# Patient Record
Sex: Male | Born: 1985 | Race: White | Hispanic: No | Marital: Single | Smoking: Current every day smoker
Health system: Southern US, Community
[De-identification: ages and names within clinical notes are randomized; demographics above are authoritative.]

## PROBLEM LIST (undated history)

## (undated) DIAGNOSIS — Q819 Epidermolysis bullosa, unspecified: Secondary | ICD-10-CM

## (undated) DIAGNOSIS — J45909 Unspecified asthma, uncomplicated: Secondary | ICD-10-CM

## (undated) DIAGNOSIS — F419 Anxiety disorder, unspecified: Secondary | ICD-10-CM

## (undated) DIAGNOSIS — T7840XA Allergy, unspecified, initial encounter: Secondary | ICD-10-CM

## (undated) HISTORY — PX: APPENDECTOMY: SHX54

## (undated) HISTORY — DX: Allergy, unspecified, initial encounter: T78.40XA

## (undated) HISTORY — DX: Epidermolysis bullosa, unspecified: Q81.9

## (undated) HISTORY — DX: Unspecified asthma, uncomplicated: J45.909

## (undated) HISTORY — DX: Anxiety disorder, unspecified: F41.9

---
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
## 2004-07-02 ENCOUNTER — Emergency Department (HOSPITAL_COMMUNITY): Admission: EM | Admit: 2004-07-02 | Discharge: 2004-07-02 | Payer: Self-pay | Admitting: Emergency Medicine

## 2005-06-11 ENCOUNTER — Emergency Department (HOSPITAL_COMMUNITY): Admission: EM | Admit: 2005-06-11 | Discharge: 2005-06-11 | Payer: Self-pay | Admitting: Emergency Medicine

## 2005-12-18 ENCOUNTER — Emergency Department (HOSPITAL_COMMUNITY): Admission: EM | Admit: 2005-12-18 | Discharge: 2005-12-18 | Payer: Self-pay | Admitting: Emergency Medicine

## 2011-01-05 ENCOUNTER — Emergency Department (HOSPITAL_COMMUNITY)
Admission: EM | Admit: 2011-01-05 | Discharge: 2011-01-05 | Disposition: A | Discharge status: Home / Self Care | Payer: Self-pay | Attending: Emergency Medicine | Admitting: Emergency Medicine

## 2011-01-05 DIAGNOSIS — M5430 Sciatica, unspecified side: Secondary | ICD-10-CM | POA: Insufficient documentation

## 2011-01-05 DIAGNOSIS — M549 Dorsalgia, unspecified: Secondary | ICD-10-CM | POA: Insufficient documentation

## 2012-04-25 ENCOUNTER — Ambulatory Visit: Payer: Self-pay | Admitting: Family Medicine

## 2012-04-25 VITALS — BP 111/74 | HR 82 | Temp 99.2°F | Resp 18 | Ht 70.5 in | Wt 190.0 lb

## 2012-04-25 DIAGNOSIS — L02411 Cutaneous abscess of right axilla: Secondary | ICD-10-CM

## 2012-04-25 DIAGNOSIS — IMO0002 Reserved for concepts with insufficient information to code with codable children: Secondary | ICD-10-CM

## 2012-04-25 MED ORDER — HYDROCODONE-ACETAMINOPHEN 5-325 MG PO TABS: 1.0000 | ORAL_TABLET | Freq: Four times a day (QID) | ORAL | Status: DC | PRN

## 2012-04-25 MED ORDER — DOXYCYCLINE HYCLATE 100 MG PO TABS: 100.0000 mg | ORAL_TABLET | Freq: Two times a day (BID) | ORAL | Status: DC

## 2012-04-25 NOTE — Progress Notes (Signed)
Is a 27 year old man with a history of boils on his abdomen who comes in with 5 days of swelling, pain in his right axilla.  Objective: 2 cm swollen red mass in the right axilla consistent with an abscess  Please see PA note on I&D  Assessment: Right axillary abscess with recurrent boils  Plan: 1. Abscess of axilla, right  doxycycline (VIBRA-TABS) 100 MG tablet, HYDROcodone-acetaminophen (NORCO/VICODIN) 5-325 MG per tablet

## 2012-04-25 NOTE — Progress Notes (Signed)
Patient ID: ITAY MELLA MRN: [id number], DOB: 26-Aug-1985, 27 y.o. Date of Encounter: 04/25/2012, 7:24 PM    PROCEDURE NOTE: Verbal consent obtained. Betadine prep per usual protocol. Local anesthesia obtained with 2% lidocaine plain.  1 cm incision made with 11 blade along lesion.  Copious purulence expressed. Lesion explored revealing no loculations. Packed with 1/4 plain packing. Dressed. Wound care instructions including precautions with patient. Patient tolerated the procedure well. Recheck in 48 hours.      Grier Mitts, PA-C 04/25/2012 7:24 PM

## 2012-04-25 NOTE — Patient Instructions (Signed)
Abscess An abscess is an infected area that contains a collection of pus and debris. It can occur in almost any part of the body. An abscess is also known as a furuncle or boil. CAUSES   An abscess occurs when tissue gets infected. This can occur from blockage of oil or sweat glands, infection of hair follicles, or a minor injury to the skin. As the body tries to fight the infection, pus collects in the area and creates pressure under the skin. This pressure causes pain. People with weakened immune systems have difficulty fighting infections and get certain abscesses more often.   SYMPTOMS Usually an abscess develops on the skin and becomes a painful mass that is red, warm, and tender. If the abscess forms under the skin, you may feel a moveable soft area under the skin. Some abscesses break open (rupture) on their own, but most will continue to get worse without care. The infection can spread deeper into the body and eventually into the bloodstream, causing you to feel ill.   DIAGNOSIS   Your caregiver will take your medical history and perform a physical exam. A sample of fluid may also be taken from the abscess to determine what is causing your infection. TREATMENT   Your caregiver may prescribe antibiotic medicines to fight the infection. However, taking antibiotics alone usually does not cure an abscess. Your caregiver may need to make a small cut (incision) in the abscess to drain the pus. In some cases, gauze is packed into the abscess to reduce pain and to continue draining the area. HOME CARE INSTRUCTIONS    Only take over-the-counter or prescription medicines for pain, discomfort, or fever as directed by your caregiver.   If you were prescribed antibiotics, take them as directed. Finish them even if you start to feel better.   If gauze is used, follow your caregiver's directions for changing the gauze.   To avoid spreading the infection:   Keep your draining abscess covered with a  bandage.   Wash your hands well.   Do not share personal care items, towels, or whirlpools with others.   Avoid skin contact with others.   Keep your skin and clothes clean around the abscess.   Keep all follow-up appointments as directed by your caregiver.  SEEK MEDICAL CARE IF:    You have increased pain, swelling, redness, fluid drainage, or bleeding.   You have muscle aches, chills, or a general ill feeling.   You have a fever.  MAKE SURE YOU:    Understand these instructions.   Will watch your condition.   Will get help right away if you are not doing well or get worse.  Document Released: 12/31/2004 Document Revised: 09/22/2011 Document Reviewed: 06/05/2011 [employer] Patient Information 2013 [employer].    

## 2012-04-27 ENCOUNTER — Telehealth: Payer: Self-pay
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
## 2012-04-28 ENCOUNTER — Ambulatory Visit (INDEPENDENT_AMBULATORY_CARE_PROVIDER_SITE_OTHER): Payer: Self-pay | Admitting: Physician Assistant

## 2012-04-28 VITALS — BP 110/78 | HR 88 | Temp 99.4°F | Resp 18

## 2012-04-28 DIAGNOSIS — R062 Wheezing: Secondary | ICD-10-CM

## 2012-04-28 DIAGNOSIS — J40 Bronchitis, not specified as acute or chronic: Secondary | ICD-10-CM

## 2012-04-28 DIAGNOSIS — IMO0002 Reserved for concepts with insufficient information to code with codable children: Secondary | ICD-10-CM

## 2012-04-28 DIAGNOSIS — L02419 Cutaneous abscess of limb, unspecified: Secondary | ICD-10-CM

## 2012-04-28 MED ORDER — PREDNISONE 20 MG PO TABS: ORAL_TABLET | ORAL | Status: DC

## 2012-04-28 NOTE — Telephone Encounter (Signed)
Left message for him to call back. He is due to follow up today regarding his abscesses.

## 2012-04-28 NOTE — Progress Notes (Signed)
Patient ID: Daniel Richmond MRN: [id number], DOB: 01/14/1986 27 y.o. Date of Encounter: 04/28/2012, 3:12 PM  Chief Complaint: Wound care   See previous note  HPI: 27 y.o. y/o male presents for wound care s/p I&D on 04/25/12. Doing well No issues or complaints Afebrile/ no chills No nausea or vomiting Tolerating doxycycline Pain resolved.  Daily dressing change Previous note reviewed  Patient also complains of 3 day history of cough and chest pains. He has had fever and chills that have improved slightly today. He does have a history of asthma controlled with albuterol inhaler as needed.  He does continue to smoke - about 6 cigarettes per day.  He denies nasal congestion, sore throat, nausea, vomiting, hemoptysis, or dizziness.    Past Medical History  Diagnosis Date  . Allergy   . Asthma   . Anxiety   . 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 Authorizing Provider  b complex vitamins tablet Take 1 tablet by mouth daily.   Yes Historical Provider, MD  doxycycline (VIBRA-TABS) 100 MG tablet Take 1 tablet (100 mg total) by mouth 2 (two) times daily. 04/25/12  Yes Elvina Sidle, MD  fish oil-omega-3 fatty acids 1000 MG capsule Take 2 g by mouth daily.   Yes Historical Provider, MD  HYDROcodone-acetaminophen (NORCO/VICODIN) 5-325 MG per tablet Take 1 tablet by mouth every 6 (six) hours as needed for pain. 04/25/12  Yes Elvina Sidle, MD  magnesium gluconate (MAGONATE) 500 MG tablet Take 500 mg by mouth 2 (two) times daily.   Yes Historical Provider, MD  Probiotic Product (ACIDOPHILUS PROBIOTIC BLEND PO) Take by mouth.   Yes Historical Provider, MD  sertraline (ZOLOFT) 50 MG tablet Take 50 mg by mouth daily.   Yes Historical Provider, MD  Multiple Vitamin (MULTIVITAMIN) tablet Take 1 tablet by mouth daily.    Historical Provider, MD  predniSONE (DELTASONE) 20 MG tablet Take 3 PO QAM x3days, 2 PO QAM  x3days, 1 PO QAM x3days 04/28/12   Nelva Nay, PA-C    Allergies: No Known Allergies  ROS: Constitutional: Afebrile, no chills Cardiovascular: negative for chest pain or palpitations Dermatological: Positive for wound. Negative for erythema, pain, or warmth  GI: No nausea or vomiting   EXAM: Physical Exam: Blood pressure 110/78, pulse 88, temperature 99.4 F (37.4 C), temperature source Oral, resp. rate 18., There is no height or weight on file to calculate BMI. General: Well developed, well nourished, in no acute distress. Nontoxic appearing. Head: Normocephalic, atraumatic, sclera non-icteric.  Neck: Supple. Lungs: Diffuse wheezing throughout lung fields. No rales or rhonchi noted.  Heart: Normal rate. Skin:  Warm and moist. Dressing and packing in place. No induration, erythema, or tenderness to palpation. Neuro: Alert and oriented X 3. Moves all extremities spontaneously. Normal gait.  Psych:  Responds to questions appropriately with a normal affect.       PROCEDURE: Dressing and packing removed. Minimal purulence expressed Wound bed healthy Irrigated with 1% plain lidocaine 5 cc. Repacked with 1/4 plain packing. Dressing applied  LAB: Culture: none  A/P: 27 y.o. y/o male with axillary cellulitis/abscess as above s/p I&D on 04/25/12 Wound care per above Continue doxycycline. Pain well controlled Daily dressing changes Recheck 48 hours  Asthma flare - will treat with prednisone taper At home nebulizer at least once daily.  Continue doxycycline Recheck lungs at recheck in 48 hours - if no improvement may need labs/x-ray/nebulizer  Grier Mitts, PA-C 04/28/2012 3:12 PM

## 2012-04-29 NOTE — Telephone Encounter (Signed)
Patient came in to be seen yesterday.

## 2012-04-30 ENCOUNTER — Ambulatory Visit (INDEPENDENT_AMBULATORY_CARE_PROVIDER_SITE_OTHER): Payer: Self-pay | Admitting: Physician Assistant

## 2012-04-30 VITALS — BP 108/72 | HR 78 | Temp 97.9°F | Resp 16

## 2012-04-30 DIAGNOSIS — IMO0002 Reserved for concepts with insufficient information to code with codable children: Secondary | ICD-10-CM

## 2012-04-30 DIAGNOSIS — L02411 Cutaneous abscess of right axilla: Secondary | ICD-10-CM

## 2012-04-30 NOTE — Progress Notes (Signed)
   [address] [place] [address]   Phone [phone]  Subjective:    Patient ID: Daniel Richmond, male    DOB: Mar 30, 1986, 27 y.o.   MRN: [id number]  HPI Pt presents to clinic for abscess and asthma recheck. He has been taking his medication without problems.  He feels much better in regards to his asthma - his breathing is almost back to normal.  The packing from his abscess fell out last night and he is worried that it is closing to early.  He has been  Doing warm compresses and friction to keep it open over night.  He has purulence expressed from it this am and last night.   Review of Systems  Constitutional: Negative for fever and chills.  Respiratory: Positive for cough (less), shortness of breath (less) and wheezing (less).   Skin: Positive for wound.       Objective:   Physical Exam  Vitals reviewed. Constitutional: He is oriented to person, place, and time. He appears well-developed and well-nourished.  HENT:  Head: Normocephalic and atraumatic.  Right Ear: External ear normal.  Left Ear: External ear normal.  Eyes: Conjunctivae normal are normal.  Cardiovascular: Regular rhythm and normal heart sounds.   Pulmonary/Chest: Effort normal and breath sounds normal. No respiratory distress. He has no wheezes.  Neurological: He is alert and oriented to person, place, and time.  Skin: Skin is warm and dry.       Wound with some mild surrounding erythema and induration.  Very small amount of purulent discharge expressed.  About 1 cm deep and repacked with 1/4 plain packing - Drsg placed.  Psychiatric: He has a normal mood and affect. His behavior is normal. Judgment and thought content normal.        Assessment & Plan:

## 2012-12-15 ENCOUNTER — Emergency Department (HOSPITAL_BASED_OUTPATIENT_CLINIC_OR_DEPARTMENT_OTHER): Payer: Self-pay

## 2012-12-15 ENCOUNTER — Emergency Department (HOSPITAL_BASED_OUTPATIENT_CLINIC_OR_DEPARTMENT_OTHER)
Admission: EM | Admit: 2012-12-15 | Discharge: 2012-12-15 | Disposition: A | Discharge status: Home / Self Care | Payer: Self-pay | Attending: Emergency Medicine | Admitting: Emergency Medicine

## 2012-12-15 ENCOUNTER — Encounter (HOSPITAL_BASED_OUTPATIENT_CLINIC_OR_DEPARTMENT_OTHER): Payer: Self-pay | Admitting: *Deleted

## 2012-12-15 DIAGNOSIS — S0181XA Laceration without foreign body of other part of head, initial encounter: Secondary | ICD-10-CM

## 2012-12-15 DIAGNOSIS — F17200 Nicotine dependence, unspecified, uncomplicated: Secondary | ICD-10-CM | POA: Insufficient documentation

## 2012-12-15 DIAGNOSIS — S0990XA Unspecified injury of head, initial encounter: Secondary | ICD-10-CM | POA: Insufficient documentation

## 2012-12-15 DIAGNOSIS — J45909 Unspecified asthma, uncomplicated: Secondary | ICD-10-CM | POA: Insufficient documentation

## 2012-12-15 DIAGNOSIS — Z792 Long term (current) use of antibiotics: Secondary | ICD-10-CM | POA: Insufficient documentation

## 2012-12-15 DIAGNOSIS — IMO0002 Reserved for concepts with insufficient information to code with codable children: Secondary | ICD-10-CM | POA: Insufficient documentation

## 2012-12-15 DIAGNOSIS — Z791 Long term (current) use of non-steroidal anti-inflammatories (NSAID): Secondary | ICD-10-CM | POA: Insufficient documentation

## 2012-12-15 DIAGNOSIS — T07XXXA Unspecified multiple injuries, initial encounter: Secondary | ICD-10-CM

## 2012-12-15 DIAGNOSIS — Z8776 Personal history of (corrected) congenital malformations of integument, limbs and musculoskeletal system: Secondary | ICD-10-CM | POA: Insufficient documentation

## 2012-12-15 DIAGNOSIS — Z79899 Other long term (current) drug therapy: Secondary | ICD-10-CM | POA: Insufficient documentation

## 2012-12-15 DIAGNOSIS — Z87768 Personal history of other specified (corrected) congenital malformations of integument, limbs and musculoskeletal system: Secondary | ICD-10-CM | POA: Insufficient documentation

## 2012-12-15 DIAGNOSIS — S0180XA Unspecified open wound of other part of head, initial encounter: Secondary | ICD-10-CM | POA: Insufficient documentation

## 2012-12-15 DIAGNOSIS — F411 Generalized anxiety disorder: Secondary | ICD-10-CM | POA: Insufficient documentation

## 2012-12-15 MED ORDER — ACETAMINOPHEN 500 MG PO TABS
1000.0000 mg | ORAL_TABLET | Freq: Once | ORAL | Status: AC
  Administered 2012-12-15: 1000 mg via ORAL

## 2012-12-15 MED ORDER — ACETAMINOPHEN 500 MG PO TABS
ORAL_TABLET | ORAL | Status: AC
  Administered 2012-12-15: 1000 mg via ORAL
  Filled 2012-12-15: qty 2

## 2012-12-15 MED ORDER — TRAMADOL HCL 50 MG PO TABS: 50.0000 mg | ORAL_TABLET | Freq: Four times a day (QID) | ORAL | Status: DC | PRN

## 2012-12-15 MED ORDER — NAPROXEN 250 MG PO TABS
ORAL_TABLET | ORAL | Status: AC
  Administered 2012-12-15: 500 mg via ORAL
  Filled 2012-12-15: qty 2

## 2012-12-15 MED ORDER — KETOROLAC TROMETHAMINE 60 MG/2ML IM SOLN
60.0000 mg | Freq: Once | INTRAMUSCULAR | Status: DC
  Filled 2012-12-15: qty 2

## 2012-12-15 MED ORDER — NAPROXEN 250 MG PO TABS
500.0000 mg | ORAL_TABLET | Freq: Once | ORAL | Status: AC
  Administered 2012-12-15: 500 mg via ORAL

## 2012-12-15 MED ORDER — LIDOCAINE-EPINEPHRINE-TETRACAINE (LET) SOLUTION
3.0000 mL | Freq: Once | NASAL | Status: AC
  Administered 2012-12-15: 05:00:00 3 mL via TOPICAL
  Filled 2012-12-15: qty 3
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
## 2012-12-15 NOTE — ED Notes (Signed)
MD at bedside suturing

## 2012-12-22 ENCOUNTER — Ambulatory Visit: Payer: Self-pay | Admitting: Family Medicine

## 2012-12-22 ENCOUNTER — Ambulatory Visit: Payer: Self-pay

## 2012-12-22 VITALS — BP 128/76 | HR 86 | Temp 98.6°F | Resp 20 | Ht 69.25 in | Wt 186.0 lb

## 2012-12-22 DIAGNOSIS — L02411 Cutaneous abscess of right axilla: Secondary | ICD-10-CM

## 2012-12-22 DIAGNOSIS — R079 Chest pain, unspecified: Secondary | ICD-10-CM

## 2012-12-22 DIAGNOSIS — R0781 Pleurodynia: Secondary | ICD-10-CM

## 2012-12-22 DIAGNOSIS — S0081XA Abrasion of other part of head, initial encounter: Secondary | ICD-10-CM

## 2012-12-22 DIAGNOSIS — IMO0002 Reserved for concepts with insufficient information to code with codable children: Secondary | ICD-10-CM

## 2012-12-22 DIAGNOSIS — F411 Generalized anxiety disorder: Secondary | ICD-10-CM | POA: Insufficient documentation

## 2012-12-22 MED ORDER — HYDROCODONE-ACETAMINOPHEN 5-325 MG PO TABS: 1.0000 | ORAL_TABLET | Freq: Four times a day (QID) | ORAL | Status: DC | PRN

## 2012-12-22 MED ORDER — MELOXICAM 15 MG PO TABS: 15.0000 mg | ORAL_TABLET | Freq: Every day | ORAL | Status: DC

## 2012-12-22 MED ORDER — MUPIROCIN CALCIUM 2 % EX CREA: TOPICAL_CREAM | Freq: Three times a day (TID) | CUTANEOUS | Status: DC
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
## 2012-12-22 NOTE — Progress Notes (Signed)
History and physical exam reviewed in detail with Benny Lennert, PA-C.  Rib films reviewed and negative.  Agree with A/P.

## 2013-06-30 ENCOUNTER — Encounter (HOSPITAL_BASED_OUTPATIENT_CLINIC_OR_DEPARTMENT_OTHER): Payer: Self-pay | Admitting: Emergency Medicine

## 2013-06-30 ENCOUNTER — Emergency Department (HOSPITAL_BASED_OUTPATIENT_CLINIC_OR_DEPARTMENT_OTHER)
Admission: EM | Admit: 2013-06-30 | Discharge: 2013-06-30 | Disposition: A | Discharge status: Home / Self Care | Payer: Self-pay | Attending: Emergency Medicine | Admitting: Emergency Medicine

## 2013-06-30 DIAGNOSIS — S61209A Unspecified open wound of unspecified finger without damage to nail, initial encounter: Secondary | ICD-10-CM | POA: Insufficient documentation

## 2013-06-30 DIAGNOSIS — S61219A Laceration without foreign body of unspecified finger without damage to nail, initial encounter: Secondary | ICD-10-CM

## 2013-06-30 DIAGNOSIS — Y9389 Activity, other specified: Secondary | ICD-10-CM | POA: Insufficient documentation

## 2013-06-30 DIAGNOSIS — W268XXA Contact with other sharp object(s), not elsewhere classified, initial encounter: Secondary | ICD-10-CM | POA: Insufficient documentation

## 2013-06-30 DIAGNOSIS — Z87768 Personal history of other specified (corrected) congenital malformations of integument, limbs and musculoskeletal system: Secondary | ICD-10-CM | POA: Insufficient documentation

## 2013-06-30 DIAGNOSIS — J45909 Unspecified asthma, uncomplicated: Secondary | ICD-10-CM | POA: Insufficient documentation

## 2013-06-30 DIAGNOSIS — Z791 Long term (current) use of non-steroidal anti-inflammatories (NSAID): Secondary | ICD-10-CM | POA: Insufficient documentation

## 2013-06-30 DIAGNOSIS — Z8776 Personal history of (corrected) congenital malformations of integument, limbs and musculoskeletal system: Secondary | ICD-10-CM | POA: Insufficient documentation

## 2013-06-30 DIAGNOSIS — F17200 Nicotine dependence, unspecified, uncomplicated: Secondary | ICD-10-CM | POA: Insufficient documentation

## 2013-06-30 DIAGNOSIS — Y929 Unspecified place or not applicable: Secondary | ICD-10-CM | POA: Insufficient documentation

## 2013-06-30 DIAGNOSIS — Z79899 Other long term (current) drug therapy: Secondary | ICD-10-CM | POA: Insufficient documentation

## 2013-06-30 DIAGNOSIS — Z792 Long term (current) use of antibiotics: Secondary | ICD-10-CM | POA: Insufficient documentation

## 2013-06-30 DIAGNOSIS — F411 Generalized anxiety disorder: Secondary | ICD-10-CM | POA: Insufficient documentation

## 2013-06-30 MED ORDER — BUPIVACAINE HCL 0.25 % IJ SOLN
15.0000 mL | Freq: Once | INTRAMUSCULAR | Status: AC
  Administered 2013-06-30: 15 mL
  Filled 2013-06-30: qty 1

## 2013-06-30 MED ORDER — CEPHALEXIN 500 MG PO CAPS: 500.0000 mg | ORAL_CAPSULE | Freq: Four times a day (QID) | ORAL | Status: DC

## 2013-06-30 NOTE — ED Provider Notes (Signed)
CSN: [id number]     Arrival date & time 06/30/13  1548 History   First MD Initiated Contact with Patient 06/30/13 1551     No chief complaint on file.    (Consider location/radiation/quality/duration/timing/severity/associated sxs/prior Treatment) Patient is a 28 y.o. male presenting with skin laceration. The history is provided by the patient.  Laceration Location:  Finger Finger laceration location:  L index finger Depth:  Through underlying tissue Time since incident:  1 hour Laceration mechanism:  Metal edge  Vicenta AlyMichael R Tristan is a 28 y.o. male who presents to the ED with a laceration to the left index finger. He states he was working with a drill press and it slipped and a metal edge hit his finger and cut it. He has had pressure on his finger to control the bleeding. He is having some pain at the sight of the injury. He denies any other injuries.  He is up to date on his tetanus.  Past Medical History  Diagnosis Date  . Allergy   . Asthma   . Anxiety   . Epidermolysis bullosa 01/12/1985   Past Surgical History  Procedure Laterality Date  . Appendectomy  age 28   Family History  Problem Relation Age of Onset  . Multiple sclerosis Mother   . Cancer Maternal Grandmother     lung cancer   History  Substance Use Topics  . Smoking status: Current Every Day Smoker -- 2.00 packs/day for 7 years    Types: Cigarettes    Start date: 04/25/2005  . Smokeless tobacco: Never Used  . Alcohol Use: 1 - 1.5 oz/week    2-3 drink(s) per week    Review of Systems Negative except as stated in HPI   Allergies  Review of patient's allergies indicates no known allergies.  Home Medications   Current Outpatient Rx  Name  Route  Sig  Dispense  Refill  . HYDROcodone-acetaminophen (NORCO/VICODIN) 5-325 MG per tablet   Oral   Take 1 tablet by mouth every 6 (six) hours as needed for pain.   30 tablet   0   . meloxicam (MOBIC) 15 MG tablet   Oral   Take 1 tablet (15 mg total) by mouth  daily.   30 tablet   1   . mupirocin cream (BACTROBAN) 2 %   Topical   Apply topically 3 (three) times daily.   15 g   0     If the ointment is cheaper please change to ointme ...   . sertraline (ZOLOFT) 50 MG tablet   Oral   Take 50 mg by mouth daily.         . traMADol (ULTRAM) 50 MG tablet   Oral   Take 1 tablet (50 mg total) by mouth every 6 (six) hours as needed for pain.   13 tablet   0    There were no vitals taken for this visit. Physical Exam  Nursing note and vitals reviewed. Constitutional: He is oriented to person, place, and time. He appears well-developed and well-nourished. No distress.  HENT:  Head: Normocephalic.  Eyes: Conjunctivae and EOM are normal.  Neck: Neck supple.  Cardiovascular: Normal rate.   Pulmonary/Chest: Effort normal.  Musculoskeletal: Normal range of motion.       Hands: Avulsion laceration to the palmar aspect of the left index finger. Adequate circulation, good touch sensation, good strength. No tendon defect.   Neurological: He is alert and oriented to person, place, and time. No cranial nerve  deficit.  Skin: Skin is warm and dry.  Psychiatric: He has a normal mood and affect. His behavior is normal.    ED Course  Procedures  LACERATION REPAIR Performed by: NEESE,HOPE Authorized by: NEESE,HOPE Consent: Verbal consent obtained. Risks and benefits: risks, benefits and alternatives were discussed Consent given by: patient Patient identity confirmed: provided demographic data Prepped and Draped in normal sterile fashion Wound explored  DIGITAL BLOCK  Laceration Location: left index finger palmer aspect  Laceration Length: 2cm  No Foreign Bodies seen or palpated  Anesthesia: Digital block with Sensorcaine 0.25 and Lidocaine 2% without epinephrine  Anesthetic total: 4 ml  Soaked in betadine solution and NSS then scrubbed with Betadine brush  Irrigation method: syringe with NSS  Skin closure: 5-0 prolene  Number  of sutures: 5  Technique: interrupted  Patient tolerance: Patient tolerated the procedure well with no immediate complications.   MDM  28 y.o. male with 2 cm laceration to the left index finger. Will start Keflex and he will follow up in one week for suture removal or return sooner for signs of infection or any problems. Discussed with the patient and all questioned fully answered.   Medication List    TAKE these medications       cephALEXin 500 MG capsule  Commonly known as:  KEFLEX  Take 1 capsule (500 mg total) by mouth 4 (four) times daily.      ASK your doctor about these medications       HYDROcodone-acetaminophen 5-325 MG per tablet  Commonly known as:  NORCO/VICODIN  Take 1 tablet by mouth every 6 (six) hours as needed for pain.     meloxicam 15 MG tablet  Commonly known as:  MOBIC  Take 1 tablet (15 mg total) by mouth daily.     mupirocin cream 2 %  Commonly known as:  BACTROBAN  Apply topically 3 (three) times daily.     sertraline 50 MG tablet  Commonly known as:  ZOLOFT  Take 50 mg by mouth daily.     traMADol 50 MG tablet  Commonly known as:  ULTRAM  Take 1 tablet (50 mg total) by mouth every 6 (six) hours as needed for pain.         Janne Napoleon, [place] 06/30/13 2008

## 2013-06-30 NOTE — ED Provider Notes (Signed)
Medical screening examination/treatment/procedure(s) were performed by non-physician practitioner and as supervising physician I was immediately available for consultation/collaboration.     Darrell Hauk, MD 06/30/13 2317 

## 2013-06-30 NOTE — Discharge Instructions (Signed)
We are starting you on antibiotics to prevent infection. Follow up in 7 days for suture removal. Return sooner for any problems.   Cast or Splint Care Casts and splints support injured limbs and keep bones from moving while they heal.  HOME CARE  Keep the cast or splint uncovered during the drying period.  A plaster cast can take 24 to 48 hours to dry.  A fiberglass cast will dry in less than 1 hour.  Do not rest the cast on anything harder than a pillow for 24 hours.  Do not put weight on your injured limb. Do not put pressure on the cast. Wait for your doctor's approval.  Keep the cast or splint dry.  Cover the cast or splint with a plastic bag during baths or wet weather.  If you have a cast over your chest and belly (trunk), take sponge baths until the cast is taken off.  If your cast gets wet, dry it with a towel or blow dryer. Use the cool setting on the blow dryer.  Keep your cast or splint clean. Wash a dirty cast with a damp cloth.  Do not put any objects under your cast or splint.  Do not scratch the skin under the cast with an object. If itching is a problem, use a blow dryer on a cool setting over the itchy area.  Do not trim or cut your cast.  Do not take out the padding from inside your cast.  Exercise your joints near the cast as told by your doctor.  Raise (elevate) your injured limb on 1 or 2 pillows for the first 1 to 3 days. GET HELP IF:  Your cast or splint cracks.  Your cast or splint is too tight or too loose.  You itch badly under the cast.  Your cast gets wet or has a soft spot.  You have a bad smell coming from the cast.  You get an object stuck under the cast.  Your skin around the cast becomes red or sore.  You have new or more pain after the cast is put on. GET HELP RIGHT AWAY IF:  You have fluid leaking through the cast.  You cannot move your fingers or toes.  Your fingers or toes turn blue or white or are cool, painful, or  puffy (swollen).  You have tingling or lose feeling (numbness) around the injured area.  You have bad pain or pressure under the cast.  You have trouble breathing or have shortness of breath.  You have chest pain. Document Released: 07/23/2010 Document Revised: 11/23/2012 Document Reviewed: 09/29/2012 [facility] Patient Information 2014 [place], [place].

## 2013-06-30 NOTE — ED Notes (Signed)
Laceration to left index finger. Bleeding controlled

## 2013-07-01 ENCOUNTER — Emergency Department (HOSPITAL_BASED_OUTPATIENT_CLINIC_OR_DEPARTMENT_OTHER)
Admission: EM | Admit: 2013-07-01 | Discharge: 2013-07-01 | Disposition: A | Discharge status: Home / Self Care | Payer: Self-pay | Attending: Emergency Medicine | Admitting: Emergency Medicine

## 2013-07-01 ENCOUNTER — Encounter (HOSPITAL_BASED_OUTPATIENT_CLINIC_OR_DEPARTMENT_OTHER): Payer: Self-pay | Admitting: Emergency Medicine

## 2013-07-01 DIAGNOSIS — Z8776 Personal history of (corrected) congenital malformations of integument, limbs and musculoskeletal system: Secondary | ICD-10-CM | POA: Insufficient documentation

## 2013-07-01 DIAGNOSIS — Z791 Long term (current) use of non-steroidal anti-inflammatories (NSAID): Secondary | ICD-10-CM | POA: Insufficient documentation

## 2013-07-01 DIAGNOSIS — Z5189 Encounter for other specified aftercare: Secondary | ICD-10-CM

## 2013-07-01 DIAGNOSIS — Z87768 Personal history of other specified (corrected) congenital malformations of integument, limbs and musculoskeletal system: Secondary | ICD-10-CM | POA: Insufficient documentation

## 2013-07-01 DIAGNOSIS — F411 Generalized anxiety disorder: Secondary | ICD-10-CM | POA: Insufficient documentation

## 2013-07-01 DIAGNOSIS — Z792 Long term (current) use of antibiotics: Secondary | ICD-10-CM | POA: Insufficient documentation

## 2013-07-01 DIAGNOSIS — J45909 Unspecified asthma, uncomplicated: Secondary | ICD-10-CM | POA: Insufficient documentation

## 2013-07-01 DIAGNOSIS — F17200 Nicotine dependence, unspecified, uncomplicated: Secondary | ICD-10-CM | POA: Insufficient documentation

## 2013-07-01 DIAGNOSIS — Z4801 Encounter for change or removal of surgical wound dressing: Secondary | ICD-10-CM | POA: Insufficient documentation

## 2013-07-01 DIAGNOSIS — Z79899 Other long term (current) drug therapy: Secondary | ICD-10-CM | POA: Insufficient documentation

## 2013-07-01 MED ORDER — ACETAMINOPHEN-CODEINE #3 300-30 MG PO TABS: 1.0000 | ORAL_TABLET | Freq: Four times a day (QID) | ORAL | Status: DC | PRN

## 2013-07-01 MED ORDER — TRAMADOL HCL 50 MG PO TABS: 50.0000 mg | ORAL_TABLET | Freq: Four times a day (QID) | ORAL | Status: DC | PRN

## 2013-07-01 NOTE — ED Notes (Signed)
In to discharge pt and pt given Tramadol prescription by EDP - Pt now states he is allergic to Tramadol with an adverse reaction of anxiety - EDP notified and orders given for Tylenol #3.

## 2013-07-01 NOTE — ED Notes (Signed)
Pt seen here yesterday for finger injury- c/o increased pain and bleeding- good cap refill to finger tip

## 2013-07-01 NOTE — ED Notes (Addendum)
In room to discharge patient with Chanin, RN, present - pt states he feels he was treated unfair by EDP - offered to have EDP come to room and answer questions - pt irate, upset, anxious, states "If he comes back in here, I'll hit him and knock him down." - Informed pt that behavior was unacceptable and he could speak to ED Director about physician concerns. While I was at bedside with EDP, EDP asked patient if he had used any alcohol tonight (pt's eyes are red and pt is anxious) and informed patient that his pain was being caused by the laceration to his finger. Prescription for pain medication given by EDP. Pt is alone, with no driver present at this time.

## 2013-07-01 NOTE — ED Provider Notes (Signed)
CSN: [id number]     Arrival date & time 07/01/13  2013 History   First Daniel Richmond Initiated Contact with Patient 07/01/13 2207    This chart was scribed for Daniel Lyons, Daniel Richmond by Marica Otter, ED Scribe. This patient was seen in room MH08/MH08 and the patient's care was started at 10:11 PM.  Chief Complaint  Patient presents with  . Wound Check   HPI HPI Comments: Daniel Richmond is a 28 y.o. male who presents to the Emergency Department complaining of increased pain of his wounded left index finger. Pt also reports he needs a wound check for the laceration site. Pt reports he presented to the ED yesterday for laceration of his left, index finger. Pt reports he thereafter received treatment for his laceration, including sutures to the wounded area.    Past Medical History  Diagnosis Date  . Allergy   . Asthma   . Anxiety   . 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 Appendectomy  age 44   Family History  Problem Relation Age of Onset  . Multiple sclerosis Mother   . Cancer Maternal Grandmother     lung cancer   History  Substance Use Topics  . Smoking status: Current Every Day Smoker -- 2.00 packs/day for 7 years    Types: Cigarettes    Start date: 04/25/2005  . Smokeless tobacco: Never Used  . Alcohol Use: 1 - 1.5 oz/week    2-3 drink(s) per week    Review of Systems  Musculoskeletal:       Laceration left index finger. Sutures in left index finger.     A complete 10 system review of systems was obtained and all systems are negative except as noted in the HPI and PMH.    Allergies  Review of patient's allergies indicates no known allergies.  Home Medications   Current Outpatient Rx  Name  Route  Sig  Dispense  Refill  . cephALEXin (KEFLEX) 500 MG capsule   Oral   Take 1 capsule (500 mg total) by mouth 4 (four) times daily.   40 capsule   0   . sertraline (ZOLOFT) 50 MG tablet   Oral   Take 50 mg by mouth daily.          Marland Kitchen HYDROcodone-acetaminophen (NORCO/VICODIN) 5-325 MG per tablet   Oral   Take 1 tablet by mouth every 6 (six) hours as needed for pain.   30 tablet   0   . meloxicam (MOBIC) 15 MG tablet   Oral   Take 1 tablet (15 mg total) by mouth daily.   30 tablet   1   . mupirocin cream (BACTROBAN) 2 %   Topical   Apply topically 3 (three) times daily.   15 g   0     If the ointment is cheaper please change to ointme ...   . traMADol (ULTRAM) 50 MG tablet   Oral   Take 1 tablet (50 mg total) by mouth every 6 (six) hours as needed for pain.   13 tablet   0    Triage Vitals: BP 141/99  Pulse 79  Temp(Src) 98.5 F (36.9 C) (Oral)  Resp 16  Ht 5' 11.5" (1.816 m)  Wt 190 lb (86.183 kg)  BMI 26.13 kg/m2  SpO2 98% Physical Exam  Nursing note and vitals reviewed. Constitutional: He is oriented to person, place, and time. He appears well-developed and well-nourished. No distress.  Strong  odor of alcohol present.   HENT:  Head: Normocephalic and atraumatic.  Eyes: EOM are normal.  Neck: Neck supple. No tracheal deviation present.  Cardiovascular: Normal rate.   Pulmonary/Chest: Effort normal. No respiratory distress.  Musculoskeletal: Normal range of motion.  Left index finger is noted to have sutures in place across the middle phalanx. No redness or purulent drainage.   Neurological: He is alert and oriented to person, place, and time.  Skin: Skin is warm and dry.  Psychiatric: He has a normal mood and affect. His behavior is normal.    ED Course  Procedures (including critical care time) DIAGNOSTIC STUDIES: Oxygen Saturation is 98% on RA, adequate by my interpretation.    COORDINATION OF CARE:  10:17 PM-Discussed treatment plan, which includes wound check and pain meds, with pt at bedside and pt agreed to plan.   Labs Review Labs Reviewed - No data to display Imaging Review No results found.   EKG Interpretation None      MDM   Final diagnoses:  None     Patient presents here for evaluation of finger pain. He was seen yesterday and had sutures placed. He states that he is having bleeding and severe pain. The sutures are in place and I see no evidence for bleeding or infection. He is requesting pain medication and states that Tylenol has not helped. The patient appears intoxicated and has a strong odor of alcohol present. He became angry with me when I asked him if he had been drinking. I explained I asked him this because alcohol and pain medicines do not mix and I was concerned about his well-being with mixing the 2.  I prescribed him tramadol which he informed the nurse cause him some form of reaction. I then wrote for Tylenol 3. When he was presented with this prescription he became more angry and told the nurses he would "punch me in the face" if he saw me again this evening. He stated that he would be back tomorrow to have this problem figured out. It was my intention to discuss the situation further with the patient, however when he made physical threats towards me I decided that this encounter was over. He told the nurses he would be back to file a complaint in the morning.   I personally performed the services described in this documentation, which was scribed in my presence. The recorded information has been reviewed and is accurate.       Daniel Lyonsouglas Vondell Babers, Daniel Richmond 07/01/13 [id number]

## 2013-07-01 NOTE — Discharge Instructions (Signed)
Tramadol as prescribed as needed for pain.  Bacitracin dressings twice daily.   Wound Care Wound care helps prevent pain and infection.  You may need a tetanus shot if:  You cannot remember when you had your last tetanus shot.  You have never had a tetanus shot.  The injury broke your skin. If you need a tetanus shot and you choose not to have one, you may get tetanus. Sickness from tetanus can be serious. HOME CARE   Only take medicine as told by your doctor.  Clean the wound daily with mild soap and water.  Change any bandages (dressings) as told by your doctor.  Put medicated cream and a bandage on the wound as told by your doctor.  Change the bandage if it gets wet, dirty, or starts to smell.  Take showers. Do not take baths, swim, or do anything that puts your wound under water.  Rest and raise (elevate) the wound until the pain and puffiness (swelling) are better.  Keep all doctor visits as told. GET HELP RIGHT AWAY IF:   Yellowish-white fluid (pus) comes from the wound.  Medicine does not lessen your pain.  There is a red streak going away from the wound.  You have a fever. MAKE SURE YOU:   Understand these instructions.  Will watch your condition.  Will get help right away if you are not doing well or get worse. Document Released: 12/31/2007 Document Revised: 06/15/2011 Document Reviewed: 07/27/2010 [facility] Patient Information 2014 [place]ExitCare, [place].

## 2013-08-15 ENCOUNTER — Ambulatory Visit: Payer: Self-pay | Admitting: Family Medicine

## 2013-08-15 ENCOUNTER — Ambulatory Visit: Payer: Self-pay

## 2013-08-15 VITALS — BP 114/68 | HR 80 | Temp 98.6°F | Resp 12 | Ht 70.0 in | Wt 189.4 lb

## 2013-08-15 DIAGNOSIS — R0781 Pleurodynia: Secondary | ICD-10-CM

## 2013-08-15 DIAGNOSIS — M25569 Pain in unspecified knee: Secondary | ICD-10-CM

## 2013-08-15 MED ORDER — MELOXICAM 15 MG PO TABS: 15.0000 mg | ORAL_TABLET | Freq: Every day | ORAL | Status: DC

## 2013-08-15 NOTE — Progress Notes (Signed)
   Subjective:    Patient ID: Daniel Richmond, male    DOB: 07/21/1985, 28 y.o.   MRN: [id number]  HPI 28 yo male with complaint of knee pain.  Awoke with this in the morning.  No injury.  History of mild knee pain in past.  Pain worse with flexion.  No radiation of pain.  Has not taken anything for pain.  States pain is worst when squatting or bending knee currently.  PPMH:  Negative for diabetes or hypertension  SH:  Positive smoker, positive alcohol  Review of Systems  Constitutional: Negative for fever and chills.  Gastrointestinal: Negative for nausea, vomiting and abdominal pain.  Musculoskeletal: Positive for arthralgias and back pain. Negative for gait problem, joint swelling and myalgias.  Neurological: Negative for tremors and weakness.       Objective:   Physical Exam Blood pressure 114/68, pulse 80, temperature 98.6 F (37 C), temperature source Oral, resp. rate 12, height 5\' 10"  (1.778 m), weight 189 lb 6.4 oz (85.911 kg), SpO2 97.00%. Body mass index is 27.18 kg/(m^2). Well-developed, well nourished male who is awake, alert and oriented, in NAD. HEENT: [place]/AT, PERRL, EOMI.  Sclera and conjunctiva are clear.  Neck: supple, non-tender, no lymphadenopathy, thyromegaly. Lungs: normal effort Abdomen: normo-active bowel sounds, supple, non-tender, no mass or organomegaly. Extremities: right knee - no effusion.  TTP patellar tendon insertion.  No ligamentous laxity to varus or valgus stress, negative Lachman and anterior drawer.  No medial or lateral joint line tenderness. Skin: warm and dry without rash. Psychologic: good mood and appropriate affect, normal speech and behavior.   Right knee xray - no bony abnormalities, no djd or joint space narrowing.     Assessment & Plan:  1.  Knee pain - ttp most noted in patellar tendon region. No effusion or other evidence of intra-articular pathology.  Will start on Mobic and follow up as needed.  Discussed reasons to return and if  symptoms continue should consider orthopedic follow up.

## 2013-08-31 ENCOUNTER — Other Ambulatory Visit: Payer: Self-pay | Admitting: Family Medicine

## 2014-09-07 IMAGING — CR DG KNEE COMPLETE 4+V*R*
2 series · 2 of 2 positions shown · non-contrast
Comparison: None.

CLINICAL DATA: Knee pain

EXAM:
RIGHT KNEE - COMPLETE 4+ VIEW

[AP]
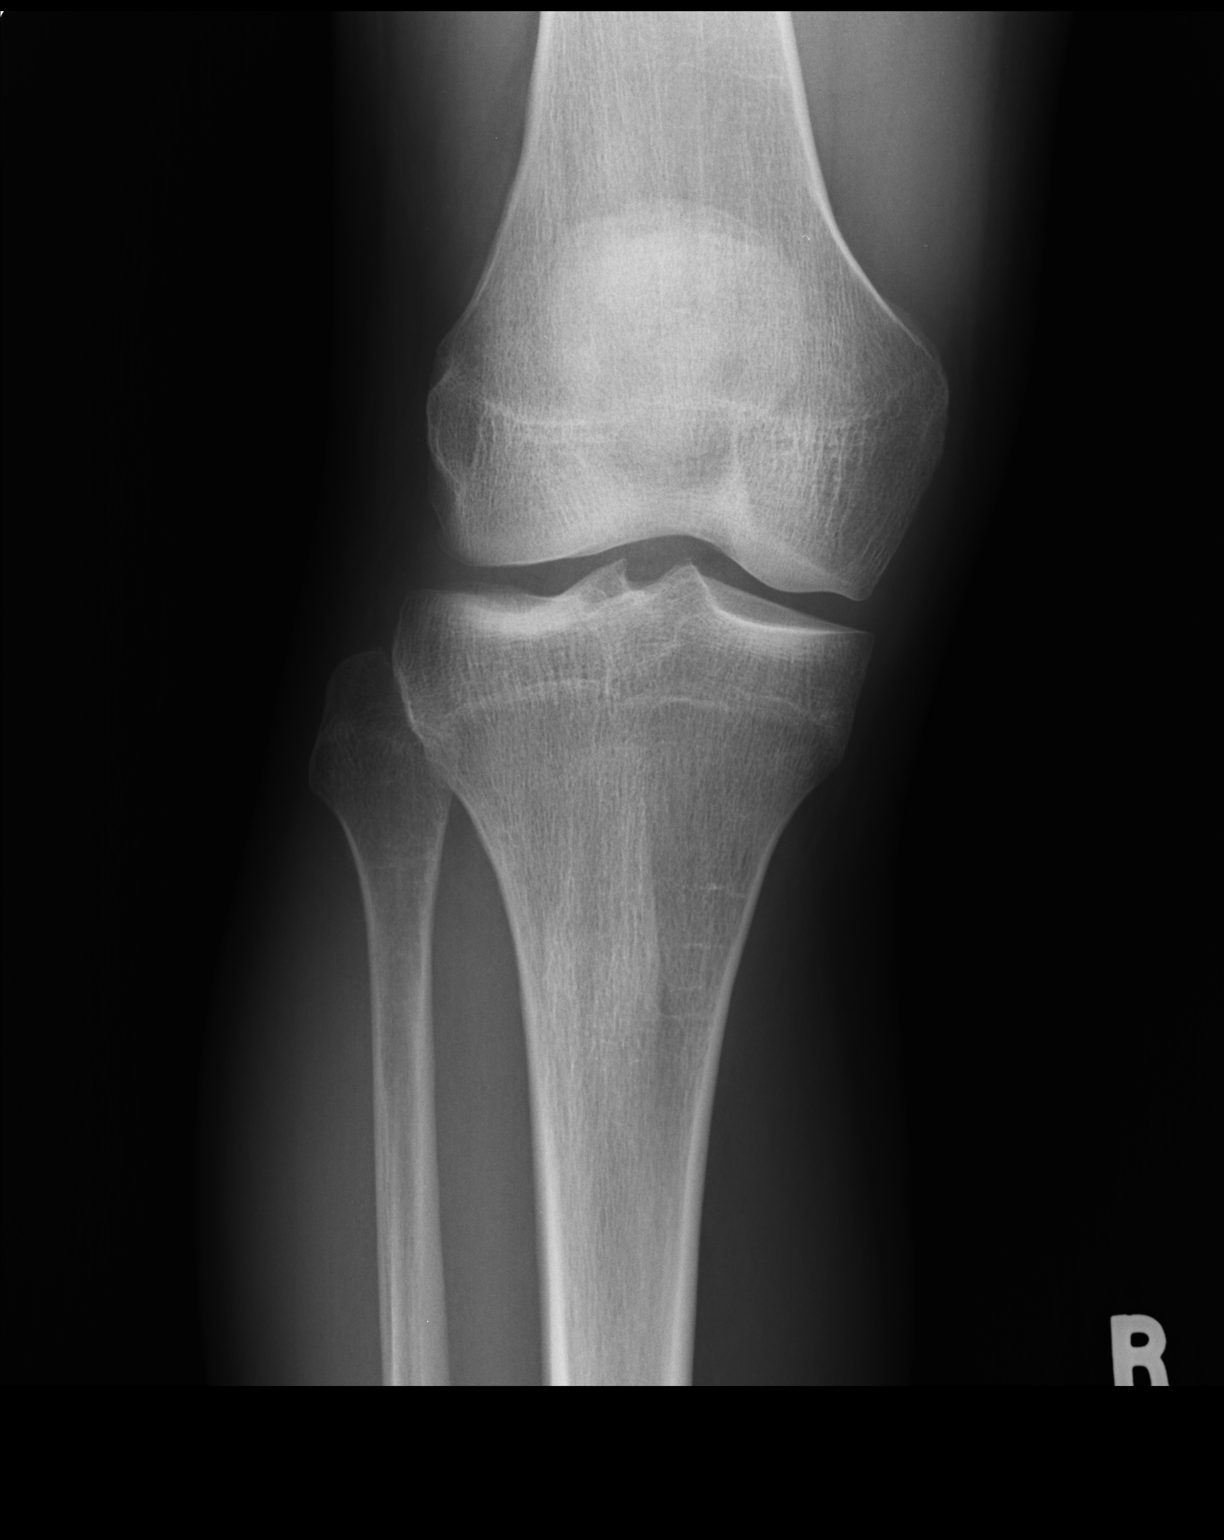

[ap int rot]
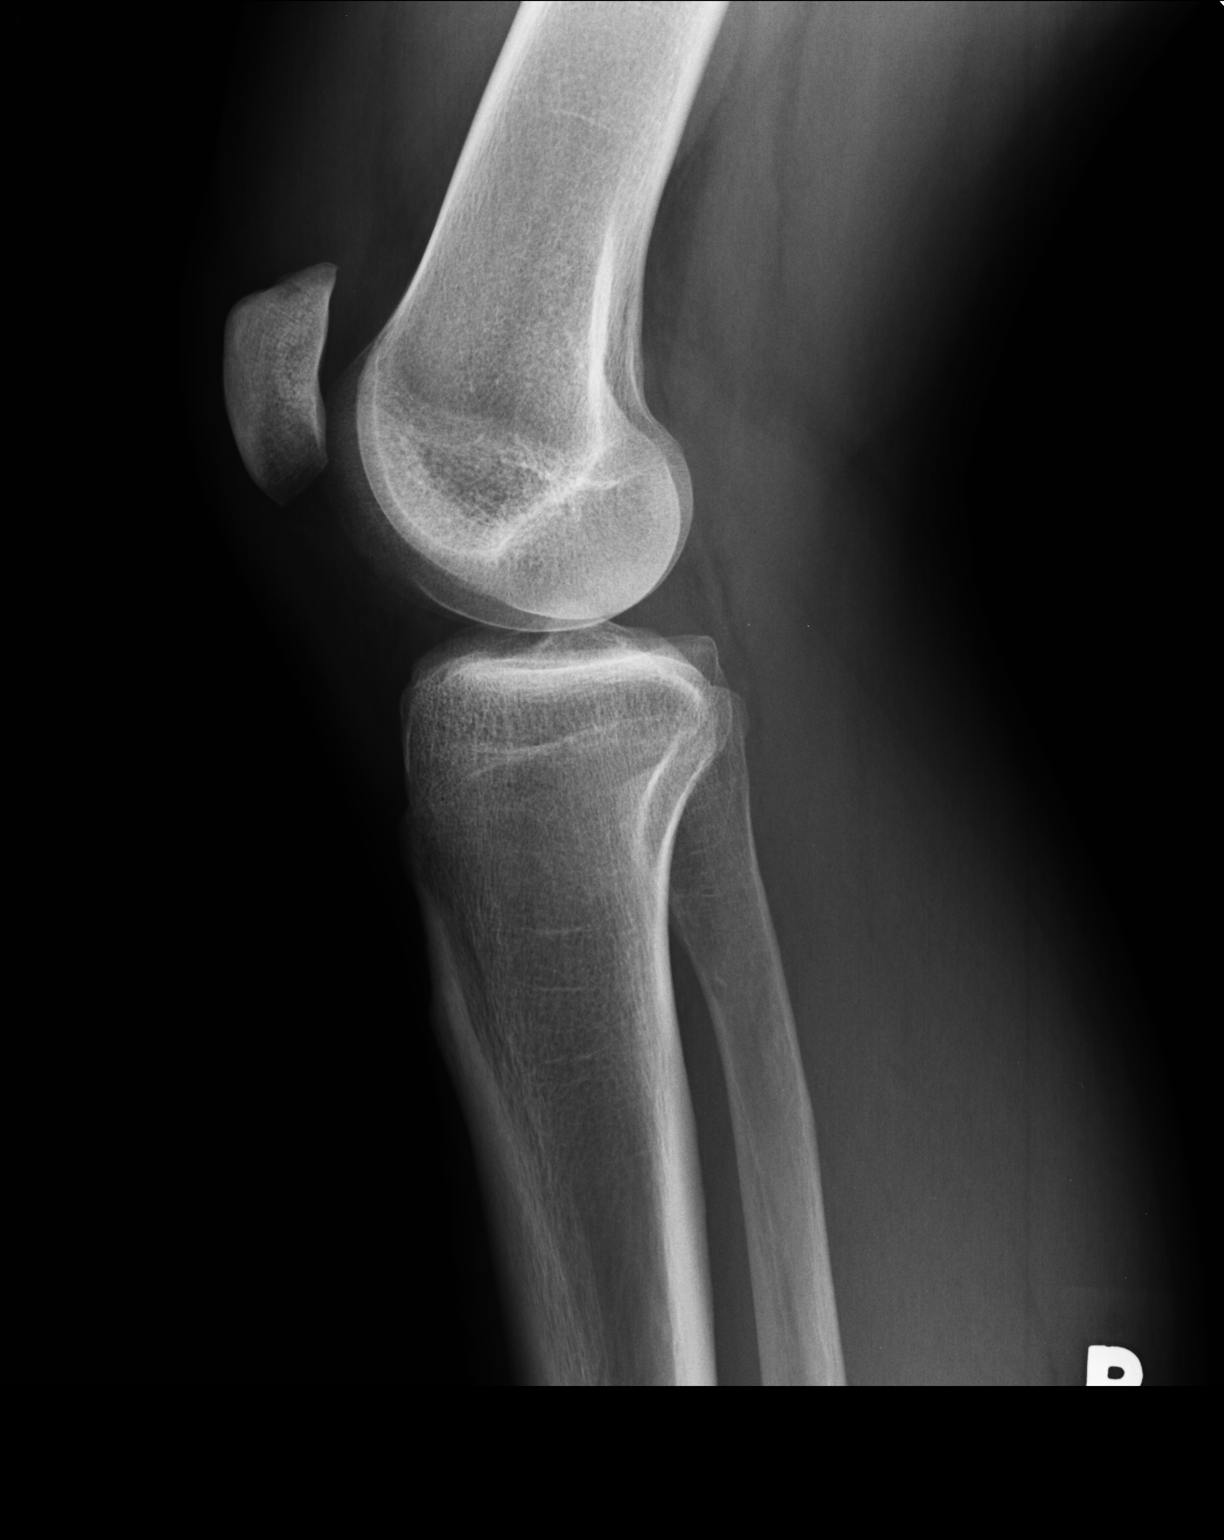

[2 of 2 positions shown; findings below may reference images not displayed]

FINDINGS: No fracture or dislocation is seen.

The joint spaces are preserved.

The visualized soft tissues are unremarkable.

No suprapatellar knee joint effusion.
IMPRESSION: No acute osseus abnormality is seen.

## 2014-10-31 ENCOUNTER — Encounter (HOSPITAL_COMMUNITY): Payer: Self-pay

## 2014-10-31 ENCOUNTER — Emergency Department (HOSPITAL_COMMUNITY)
Admission: EM | Admit: 2014-10-31 | Discharge: 2014-11-01 | Disposition: A | Discharge status: Home / Self Care | Payer: Self-pay | Attending: Emergency Medicine | Admitting: Emergency Medicine

## 2014-10-31 DIAGNOSIS — J45909 Unspecified asthma, uncomplicated: Secondary | ICD-10-CM | POA: Insufficient documentation

## 2014-10-31 DIAGNOSIS — T2642XA Burn of left eye and adnexa, part unspecified, initial encounter: Secondary | ICD-10-CM

## 2014-10-31 DIAGNOSIS — Z77098 Contact with and (suspected) exposure to other hazardous, chiefly nonmedicinal, chemicals: Secondary | ICD-10-CM | POA: Insufficient documentation

## 2014-10-31 DIAGNOSIS — Z79899 Other long term (current) drug therapy: Secondary | ICD-10-CM | POA: Insufficient documentation

## 2014-10-31 DIAGNOSIS — H578 Other specified disorders of eye and adnexa: Secondary | ICD-10-CM | POA: Insufficient documentation

## 2014-10-31 DIAGNOSIS — Q819 Epidermolysis bullosa, unspecified: Secondary | ICD-10-CM | POA: Insufficient documentation

## 2014-10-31 DIAGNOSIS — F419 Anxiety disorder, unspecified: Secondary | ICD-10-CM | POA: Insufficient documentation

## 2014-10-31 DIAGNOSIS — Z720 Tobacco use: Secondary | ICD-10-CM | POA: Insufficient documentation

## 2014-10-31 DIAGNOSIS — T2641XA Burn of right eye and adnexa, part unspecified, initial encounter: Secondary | ICD-10-CM

## 2014-10-31 MED ORDER — TETRACAINE HCL 0.5 % OP SOLN
2.0000 [drp] | Freq: Once | OPHTHALMIC | Status: AC
  Administered 2014-10-31: 2 [drp] via OPHTHALMIC
  Filled 2014-10-31: qty 2

## 2014-10-31 NOTE — ED Notes (Signed)
Bilateral morgan lens in place. Pt tolerating well.

## 2014-10-31 NOTE — ED Notes (Signed)
Patient reports he was putting AC freon in a car this afternoon at work when the line came loose and "shot Freon in my eyes."  Reports burning pain, worse in left eye.  States pain is worse when he closes his eyes.  He has been using lubricating eye drops this evening.  Bilateral eyes are extremely red and irritated.

## 2014-10-31 NOTE — ED Provider Notes (Signed)
CSN: [id number]     Arrival date & time 10/31/14  2154 History  This chart was scribed for Daniel Crumble, MD by Abel Presto, ED Scribe. This patient was seen in room WA07/WA07 and the patient's care was started at 11:11 PM.    Chief Complaint  Patient presents with  . Eye Problem     The history is provided by the patient. No language interpreter was used.   HPI Comments: Daniel Richmond is a 29 y.o. male with PMHx of asthma, allergy, and epidermolysis bullosa who presents to the Emergency Department complaining of eye injury around 4:15 PM. 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 He states he washed eyes after incident. He notes associated burning pain, worse on left and redness to eyes with associated photophobia. Pt has not taken any medication for pain. Pt also reports dysuria and urgency. He notes h/o recurrent UTI but states he has not been evaluated for symptoms. Pt denies any other injury.   Past Medical History  Diagnosis Date  . Allergy   . Asthma   . Anxiety   . Epidermolysis bullosa 02/17/1985   Past Surgical History  Procedure Laterality Date  . Appendectomy  age 76   Family History  Problem Relation Age of Onset  . Multiple sclerosis Mother   . Cancer Maternal Grandmother     lung cancer   History  Substance Use Topics  . Smoking status: Current Every Day Smoker -- 2.00 packs/day for 7 years    Types: Cigarettes    Start date: 04/25/2005  . Smokeless tobacco: Never Used  . Alcohol Use: No    Review of Systems A complete 10 system review of systems was obtained and all systems are negative except as noted in the HPI and PMH.     Allergies  Tramadol  Home Medications   Prior to Admission medications   Medication Sig Start Date End Date Taking? Authorizing Provider  sertraline (ZOLOFT) 50 MG tablet Take 50 mg by mouth daily.   Yes Historical Provider, MD  acetaminophen-codeine (TYLENOL #3) 300-30 MG per tablet Take  1-2 tablets by mouth every 6 (six) hours as needed for moderate pain. Patient not taking: Reported on 10/31/2014 07/01/13   Geoffery Lyons, MD  cephALEXin (KEFLEX) 500 MG capsule Take 1 capsule (500 mg total) by mouth 4 (four) times daily. Patient not taking: Reported on 10/31/2014 06/30/13   Janne Napoleon, NP  HYDROcodone-acetaminophen (NORCO/VICODIN) 5-325 MG per tablet Take 1 tablet by mouth every 6 (six) hours as needed for pain. Patient not taking: Reported on 10/31/2014 12/22/12   Morrell Riddle, PA-C  meloxicam (MOBIC) 15 MG tablet Take 1 tablet (15 mg total) by mouth daily. Patient not taking: Reported on 10/31/2014 08/15/13   Tarry Kos, MD  mupirocin cream (BACTROBAN) 2 % Apply topically 3 (three) times daily. Patient not taking: Reported on 10/31/2014 12/22/12   Morrell Riddle, PA-C  traMADol (ULTRAM) 50 MG tablet Take 1 tablet (50 mg total) by mouth every 6 (six) hours as needed for pain. Patient not taking: Reported on 10/31/2014 12/15/12   April Palumbo, MD   BP 125/86 mmHg  Pulse 89  Temp(Src) 98.5 F (36.9 C) (Oral)  Resp 18  SpO2 100% Physical Exam  Constitutional: He is oriented to person, place, and time. Vital signs are normal. He appears well-developed and well-nourished.  Non-toxic appearance. He does not appear ill. No distress.  HENT:  Head: Normocephalic and atraumatic.  Nose: Nose normal.  Mouth/Throat: Oropharynx is clear and moist. No oropharyngeal exudate.  Eyes: EOM are normal. Pupils are equal, round, and reactive to light. Right conjunctiva is injected. Left conjunctiva is injected. No scleral icterus.  Bilateral pH: 8 Visual Acuity: See nursing notes  Neck: Normal range of motion. Neck supple. No tracheal deviation, no edema, no erythema and normal range of motion present. No thyroid mass and no thyromegaly present.  Cardiovascular: Normal rate, regular rhythm, S1 normal, S2 normal, normal heart sounds, intact distal pulses and normal pulses.  Exam reveals no gallop  and no friction rub.   No murmur heard. Pulses:      Radial pulses are 2+ on the right side, and 2+ on the left side.       Dorsalis pedis pulses are 2+ on the right side, and 2+ on the left side.  Pulmonary/Chest: Effort normal and breath sounds normal. No respiratory distress. He has no wheezes. He has no rhonchi. He has no rales.  Abdominal: Soft. Normal appearance and bowel sounds are normal. He exhibits no distension, no ascites and no mass. There is no hepatosplenomegaly. There is no tenderness. There is no rebound, no guarding and no CVA tenderness.  Musculoskeletal: Normal range of motion. He exhibits no edema or tenderness.  Lymphadenopathy:    He has no cervical adenopathy.  Neurological: He is alert and oriented to person, place, and time. He has normal strength. No cranial nerve deficit or sensory deficit.  Skin: Skin is warm, dry and intact. No petechiae and no rash noted. He is not diaphoretic. No erythema. No pallor.  Psychiatric: He has a normal mood and affect. His behavior is normal. Judgment normal.  Nursing note and vitals reviewed.   ED Course  Procedures (including critical care time) DIAGNOSTIC STUDIES: Oxygen Saturation is 100% on room air, normal by my interpretation.    COORDINATION OF CARE: 11:19 PM Discussed treatment plan with patient at beside, the patient agrees with the plan and has no further questions at this time.   Labs Review Labs Reviewed - No data to display  Imaging Review No results found.   EKG Interpretation None      MDM   Final diagnoses:  None    Patient presents to the ED after car freon being sprayed in his eyes earlier at 4pm.  He has had significant pain ever since.  PH is 8 bilaterally.  Patient was given tetracaine and will flush out eyes with morgans lens in the ED.  Will repeat the pH level.  00:39am repeat pH level on the right is 7.5, on the left remains 8.  Will give another bag of IVF to left eye.  01:30am repeat  pH on the left is now 7.5.  Patient states he feels better as well.  He was given ophthalmology follow-up. 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  , Or Motrin for pain. He appears well in no acute distress. His vital signs remain within his normal limits and he is safe for discharge.  I personally performed the services described in this documentation, which was scribed in my presence. The recorded information has been reviewed and is accurate.     Daniel Crumble, MD 11/01/14 [phone]

## 2014-11-01 NOTE — Discharge Instructions (Signed)
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  Take tylenol or motrin for pain.  See an ophthalmologist within 3 days for close follow up.  If symptoms worsen,come back to the ED immediately.  Thank you. A thin membrane covers the eyeball and underside of the eyelids. This membrane (conjunctiva) can become irritated by chemicals. The membrane may get puffy (swollen) and red. Your eyes may become teary, sensitive to light, and gritty feeling. You may also have burning eye pain. HOME CARE  Apply a cool, clean cloth to your eye for 10 to 20 minutes, 3 to 4 times a day.  Do not rub your eyes.  Wipe away fluid from the eyes with damp tissues.  Wash your hands often with soap and water.  Wear sunglasses if light bothers you.  Do not wear eye makeup.  Do not use your soft contacts. Throw them away. Use a new pair once recovery is complete.  Do not use your hard contacts. They need to be washed (sterilized) thoroughly after recovery is complete.  Do not use machines or drive if you have blurry vision.  Only take medicine as told by your doctor.  Avoid the chemical causing the eye irritation. Use eye protection as needed. GET HELP RIGHT AWAY IF:   Your eye is still pink 3 days after treatment.  You have more pain in your eye.  You have fluid coming from either eye.  Your eyelids stick together in the morning.  You become sensitive to light.  You have a temperature by mouth above 102 F (38.9 C).  You develop pain in your face.  You have problems with your medicine.  Your vision is getting worse.  You have severe pain in your eye. MAKE SURE YOU:   Understand these instructions.  Will watch your condition.  Will get help right away if you are not doing well or get worse. Document Released: 03/23/2005 Document Revised: 06/15/2011 Document Reviewed: 07/05/2008 [facility] Patient Information 2015 [place], [place]. This information is not  intended to replace advice given to you by your health care provider. Make sure you discuss any questions you have with your health care provider.

## 2014-11-01 NOTE — ED Notes (Signed)
Morgans Lens in left eye only patient is tolerating well.

## 2015-06-11 ENCOUNTER — Emergency Department (HOSPITAL_COMMUNITY): Payer: Self-pay

## 2015-06-11 ENCOUNTER — Encounter (HOSPITAL_COMMUNITY): Payer: Self-pay | Admitting: *Deleted

## 2015-06-11 ENCOUNTER — Emergency Department (HOSPITAL_COMMUNITY)
Admission: EM | Admit: 2015-06-11 | Discharge: 2015-06-11 | Disposition: A | Discharge status: Home / Self Care | Payer: Self-pay | Attending: Emergency Medicine | Admitting: Emergency Medicine

## 2015-06-11 DIAGNOSIS — G8929 Other chronic pain: Secondary | ICD-10-CM | POA: Insufficient documentation

## 2015-06-11 DIAGNOSIS — M545 Low back pain, unspecified: Secondary | ICD-10-CM

## 2015-06-11 DIAGNOSIS — F17210 Nicotine dependence, cigarettes, uncomplicated: Secondary | ICD-10-CM | POA: Insufficient documentation

## 2015-06-11 DIAGNOSIS — F419 Anxiety disorder, unspecified: Secondary | ICD-10-CM | POA: Insufficient documentation

## 2015-06-11 DIAGNOSIS — J45909 Unspecified asthma, uncomplicated: Secondary | ICD-10-CM | POA: Insufficient documentation

## 2015-06-11 DIAGNOSIS — Q819 Epidermolysis bullosa, unspecified: Secondary | ICD-10-CM | POA: Insufficient documentation

## 2015-06-11 DIAGNOSIS — R531 Weakness: Secondary | ICD-10-CM | POA: Insufficient documentation

## 2015-06-11 DIAGNOSIS — Z79899 Other long term (current) drug therapy: Secondary | ICD-10-CM | POA: Insufficient documentation

## 2015-06-11 DIAGNOSIS — R200 Anesthesia of skin: Secondary | ICD-10-CM | POA: Insufficient documentation

## 2015-06-11 MED ORDER — METHOCARBAMOL 500 MG PO TABS: 500.0000 mg | ORAL_TABLET | Freq: Two times a day (BID) | ORAL | Status: AC

## 2015-06-11 MED ORDER — METHOCARBAMOL 500 MG PO TABS
500.0000 mg | ORAL_TABLET | Freq: Once | ORAL | Status: AC
  Administered 2015-06-11: 500 mg via ORAL
  Filled 2015-06-11: qty 1

## 2015-06-11 MED ORDER — OXYCODONE-ACETAMINOPHEN 5-325 MG PO TABS
2.0000 | ORAL_TABLET | Freq: Once | ORAL | Status: AC
  Administered 2015-06-11: 2 via ORAL
  Filled 2015-06-11: qty 2

## 2015-06-11 MED ORDER — MELOXICAM 7.5 MG PO TABS: 15.0000 mg | ORAL_TABLET | Freq: Every day | ORAL | Status: AC

## 2015-06-11 MED ORDER — PREDNISONE 20 MG PO TABS: 40.0000 mg | ORAL_TABLET | Freq: Every day | ORAL | Status: AC

## 2015-06-11 MED ORDER — PREDNISONE 20 MG PO TABS
60.0000 mg | ORAL_TABLET | Freq: Once | ORAL | Status: AC
  Administered 2015-06-11: 60 mg via ORAL
  Filled 2015-06-11: qty 3

## 2015-06-11 NOTE — ED Notes (Signed)
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

## 2015-06-11 NOTE — Discharge Instructions (Signed)

## 2015-06-11 NOTE — ED Provider Notes (Signed)
CSN: [id number]     Arrival date & time 06/11/15  1931 By signing my name below, I, Daniel Richmond, attest that this documentation has been prepared under the direction and in the presence of TRW AutomotiveKelly Skylynne Richmond, [place]-C. Electronically Signed: Iona Beardhristian Richmond, ED Scribe 06/11/2015 at 9:20 PM.   Chief Complaint  Patient presents with  . Back Pain    The history is provided by the patient. No language interpreter was used.   HPI Comments: Daniel Richmond is a 30 y.o. male who presents to the Emergency Department complaining of sudden onset, constant lower back pain, onset about four days ago after pushing a car onto a trailer. Pt says the pain was not immediate but he felt it when he bent over later in the day. Pt reports associated numbness that radiates into his right leg. He has no control over the toes in his right foot and says that the leg makes it difficult for him to walk. He has chronic back pain due to his work which often involves heavy lifting but says that he has never had episodes of numbness in the past. Pt has taken tylenol and ibuprofen with no relief to symptoms. No other worsening or alleviating factors noted. Pt denies fever, pain in the right leg, numbness to genital area, incontinence, or any other pertinent symptoms.     Past Medical History  Diagnosis Date  . Allergy   . Asthma   . Anxiety   . 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 Appendectomy  age 30   Family History  Problem Relation Age of Onset  . Multiple sclerosis Mother   . Cancer Maternal Grandmother     lung cancer   Social History  Substance Use Topics  . Smoking status: Current Every Day Smoker -- 2.00 packs/day for 7 years    Types: Cigarettes    Start date: 04/25/2005  . Smokeless tobacco: Never Used  . Alcohol Use: No    Review of Systems  Musculoskeletal: Positive for back pain and gait problem.  Neurological: Positive for weakness and numbness.        Numbness and weakness was noted in right leg.  All other systems reviewed and are negative.   A complete 10 system review of systems was obtained and all systems are negative except as noted in the HPI and PMH.    Allergies  Tramadol  Home Medications   Prior to Admission medications   Medication Sig Start Date End Date Taking? Authorizing Provider  acetaminophen (TYLENOL) 500 MG tablet Take 1,000 mg by mouth every 6 (six) hours as needed for mild pain, moderate pain or headache.   Yes Historical Provider, MD  sertraline (ZOLOFT) 50 MG tablet Take 50 mg by mouth daily.   Yes Historical Provider, MD  meloxicam (MOBIC) 7.5 MG tablet Take 2 tablets (15 mg total) by mouth daily. 06/11/15   Daniel MaduraKelly Paz Winsett, PA-C  methocarbamol (ROBAXIN) 500 MG tablet Take 1 tablet (500 mg total) by mouth 2 (two) times daily. 06/11/15   Daniel MaduraKelly Daniel Piatkowski, PA-C  predniSONE (DELTASONE) 20 MG tablet Take 2 tablets (40 mg total) by mouth daily. Take 40 mg by mouth daily for 3 days, then 20mg  by mouth daily for 3 days, then 10mg  daily for 3 days 06/11/15   Daniel MaduraKelly Daniel Jacobson, PA-C   BP 118/88 mmHg  Pulse 92  Temp(Src) 98.5 F (36.9 C) (Oral)  Resp 18  SpO2 95% Physical Exam  Constitutional: He  is oriented to person, place, and time. He appears well-developed and well-nourished. No distress.  HENT:  Head: Normocephalic and atraumatic.  Eyes: Conjunctivae and EOM are normal. No scleral icterus.  Neck: Normal range of motion.  Cardiovascular: Normal rate, regular rhythm and intact distal pulses.   DP and PT pulses 2+ in BLE  Pulmonary/Chest: Effort normal. No respiratory distress.  Respirations even and unlabored.  Musculoskeletal: Normal range of motion. He exhibits tenderness.  TTP at L4/5. No bony deformities, step offs, or crepitus. Mild lumbosacral paraspinal muscle TTP.  Neurological: He is alert and oriented to person, place, and time. Coordination normal.  Sensation to light touch intact. Patient able to bear weight, but  drags RLE with ambulation. Reflexes on the right are slightly decreased compared to the left.   Skin: Skin is warm and dry. No rash noted. He is not diaphoretic. No erythema. No pallor.  Psychiatric: He has a normal mood and affect. His behavior is normal.  Nursing note and vitals reviewed.   ED Course  Procedures (including critical care time) DIAGNOSTIC STUDIES: Oxygen Saturation is 95% on RA, adequate by my interpretation.    COORDINATION OF CARE: 9:18 PM-Discussed treatment plan which includes MR lumbar spine without contrast and percocet/roxicet with pt at bedside and pt agreed to plan.   Labs Review Labs Reviewed - No data to display  Imaging Review Mr Lumbar Spine Wo Contrast  06/11/2015  CLINICAL DATA:  Initial evaluation for acute low back pain. Numbness to right leg. EXAM: MRI LUMBAR SPINE WITHOUT CONTRAST TECHNIQUE: Multiplanar, multisequence MR imaging of the lumbar spine was performed. No intravenous contrast was administered. COMPARISON:  None. FINDINGS: For the purposes of this dictation, the lowest well-formed intervertebral disc spaces presumed to be the L5-S1 level, and there presumed to be 5 lumbar type vertebral bodies. Vertebral bodies are normally aligned with preservation of the normal lumbar lordosis. Vertebral body heights are well preserved. Signal intensity within the vertebral body bone marrow is normal. No focal osseous lesions. No marrow edema. Conus medullaris terminates normally at the L1 level. Signal intensity within the visualized cord is normal. Nerve roots of the cauda equina within normal limits. Paraspinous soft tissues within normal limits. No significant degenerative disc disease identified within the lumbar spine. Intervertebral discs are well hydrated. No focal disc herniation. No significant facet arthrosis. No canal or foraminal stenosis. IMPRESSION: Normal MRI of the lumbar spine. Electronically Signed   By: Rise Mu M.D.   On: 06/11/2015  22:30   I have personally reviewed and evaluated these images as part of my medical decision-making.   EKG Interpretation None      MDM   Final diagnoses:  Low back pain    Patient with back pain. Hx of chronic back pain, but acutely worse since pushing a car on a trailor. Patient can walk, but dragging RLE due to reported weakness. No loss of bowel or bladder control. No hx of fever, CA, or IVDU.  MR obtained given physical exam which is negative for acute changes or cauda equina. Ambulation has improved with pain control. Will d/c with RICE protocol, prednisone taper, and NSAIDs. Orthopedic referral given if symptoms persist or worsen. Patient discharged in good condition with no unaddressed concerns.  I personally performed the services described in this documentation, which was scribed in my presence. The recorded information has been reviewed and is accurate.    Filed Vitals:   06/11/15 2008  BP: 118/88  Pulse: 92  Temp: 98.5  F (36.9 C)  TempSrc: Oral  Resp: 18  SpO2: 95%       Daniel Madura, PA-C 06/11/15 2243  Tilden Fossa, MD 06/14/15 1025

## 2015-06-28 ENCOUNTER — Telehealth: Payer: Self-pay

## 2015-06-28 ENCOUNTER — Other Ambulatory Visit: Payer: Self-pay | Admitting: Physician Assistant

## 2015-06-28 ENCOUNTER — Ambulatory Visit (INDEPENDENT_AMBULATORY_CARE_PROVIDER_SITE_OTHER): Payer: Self-pay | Admitting: Physician Assistant

## 2015-06-28 VITALS — BP 114/82 | HR 83 | Temp 98.3°F | Resp 16 | Ht 70.0 in | Wt 181.6 lb

## 2015-06-28 DIAGNOSIS — Z720 Tobacco use: Secondary | ICD-10-CM

## 2015-06-28 DIAGNOSIS — R69 Illness, unspecified: Principal | ICD-10-CM

## 2015-06-28 DIAGNOSIS — R05 Cough: Secondary | ICD-10-CM

## 2015-06-28 DIAGNOSIS — R059 Cough, unspecified: Secondary | ICD-10-CM

## 2015-06-28 DIAGNOSIS — J111 Influenza due to unidentified influenza virus with other respiratory manifestations: Secondary | ICD-10-CM

## 2015-06-28 DIAGNOSIS — F17210 Nicotine dependence, cigarettes, uncomplicated: Secondary | ICD-10-CM

## 2015-06-28 LAB — POCT CBC
Granulocyte percent: 76.7 %G (ref 37–80)
HEMATOCRIT: 46.7 % (ref 43.5–53.7)
HEMOGLOBIN: 17.9 g/dL (ref 14.1–18.1)
LYMPH, POC: 2 (ref 0.6–3.4)
MCH, POC: 35.4 pg — AB (ref 27–31.2)
MCHC: 38.2 g/dL — AB (ref 31.8–35.4)
MCV: 92.5 fL (ref 80–97)
MID (CBC): 1 — AB (ref 0–0.9)
MPV: 7.6 fL (ref 0–99.8)
POC GRANULOCYTE: 9.7 — AB (ref 2–6.9)
POC LYMPH PERCENT: 15.5 %L (ref 10–50)
POC MID %: 7.8 % (ref 0–12)
Platelet Count, POC: 205 10*3/uL (ref 142–424)
RBC: 5.05 M/uL (ref 4.69–6.13)
RDW, POC: 12.9 %
WBC: 12.6 10*3/uL — AB (ref 4.6–10.2)

## 2015-06-28 MED ORDER — ALBUTEROL SULFATE (2.5 MG/3ML) 0.083% IN NEBU
2.5000 mg | INHALATION_SOLUTION | Freq: Once | RESPIRATORY_TRACT | Status: AC
  Administered 2015-06-28: 2.5 mg via RESPIRATORY_TRACT

## 2015-06-28 MED ORDER — VARENICLINE TARTRATE 0.5 MG X 11 & 1 MG X 42 PO MISC: ORAL | Status: AC

## 2015-06-28 MED ORDER — HYDROCODONE-HOMATROPINE 5-1.5 MG/5ML PO SYRP: 5.0000 mL | ORAL_SOLUTION | Freq: Three times a day (TID) | ORAL | Status: AC | PRN

## 2015-06-28 MED ORDER — IPRATROPIUM BROMIDE 0.02 % IN SOLN
0.5000 mg | Freq: Once | RESPIRATORY_TRACT | Status: AC
  Administered 2015-06-28: 0.5 mg via RESPIRATORY_TRACT

## 2015-06-28 MED ORDER — VARENICLINE TARTRATE 1 MG PO TABS: 1.0000 mg | ORAL_TABLET | Freq: Two times a day (BID) | ORAL | Status: AC

## 2015-06-28 MED ORDER — AZITHROMYCIN 250 MG PO TABS: ORAL_TABLET | ORAL | Status: AC

## 2015-06-28 NOTE — Telephone Encounter (Signed)
LM Rx ready to pick up. 

## 2015-06-28 NOTE — Telephone Encounter (Signed)
Hycodan is at the desk waiting for him.  Deliah BostonMichael Clark, MS, PA-C 3:44 PM, 06/28/2015

## 2015-06-28 NOTE — Patient Instructions (Addendum)
Take 600 mg of Ibuprofen every 6-8 hours for fever, pain, myalgia.  Return to clinic in 48 hours if not feeling better, sooner if worse.     IF you received an x-ray today, you will receive an invoice from [facility]. Please contact [facility] at [phone] with questions or concerns regarding your invoice.   IF you received labwork today, you will receive an invoice from [facility]. Please contact [facility] at [phone] with questions or concerns regarding your invoice.   Our billing staff will not be able to assist you with questions regarding bills from these companies.  You will be contacted with the lab results as soon as they are available. The fastest way to get your results is to activate your My Chart account. Instructions are located on the last page of this paperwork. If you have not heard from us regarding the results in 2 weeks, please contact this office.

## 2015-06-28 NOTE — Progress Notes (Signed)
06/28/2015 9:08 AM   DOB: Jun 27, 1985 / MRN: [id number]  SUBJECTIVE:  Daniel Richmond is a 30 y.o. male with a 14 pack year history of smoking presenting for fever that started yesterday.  Associates diarrhea, cough with chest congestion, night sweats, and ear pain.  Has tried several OTC preparations with some relief of his symptoms.  He would like to quit smoking.  States he has tried gum, patches and cold [place] and has been unsuccessful.  He would like to try medication.    He is allergic to tramadol.   He  has a past medical history of Allergy; Asthma; Anxiety; and Epidermolysis bullosa (May 10, 1984).    He  reports that he has been smoking Cigarettes.  He started smoking about 10 years ago. He has a 14 pack-year smoking history. He has never used smokeless tobacco. He reports that he does not drink alcohol or use illicit drugs. He  has no sexual activity history on file. The patient  has past surgical history that includes Appendectomy (age 43).  His family history includes Cancer in his maternal grandmother; Multiple sclerosis in his mother.  ROS  Per HPI  Problem list and medications reviewed and updated by myself where necessary, and exist elsewhere in the encounter.   OBJECTIVE:  BP 114/82 mmHg  Pulse 83  Temp(Src) 98.3 F (36.8 C) (Oral)  Resp 16  Ht  (1.778 m)  Wt 181 lb 9.6 oz (82.373 kg)  BMI 26.06 kg/m2  SpO2 96%  Physical Exam  Constitutional: He is oriented to person, place, and time. He appears well-developed.  HENT:  Head: Normocephalic.  Eyes: Conjunctivae are normal. Pupils are equal, round, and reactive to light.  Cardiovascular: Normal rate and regular rhythm.   Pulmonary/Chest: Effort normal and breath sounds normal.  Abdominal: Soft. Bowel sounds are normal.  Neurological: He is alert and oriented to person, place, and time.  Skin: Skin is warm and dry. He is not diaphoretic.  Psychiatric: He has a normal mood and affect.    Results for orders  placed or performed in visit on 06/28/15 (from the past 72 hour(s))  POCT CBC     Status: Abnormal   Collection Time: 06/28/15  8:58 AM  Result Value Ref Range   WBC 12.6 (A) 4.6 - 10.2 K/uL   Lymph, poc 2.0 0.6 - 3.4   POC LYMPH PERCENT 15.5 10 - 50 %L   MID (cbc) 1.0 (A) 0 - 0.9   POC MID % 7.8 0 - 12 %M   POC Granulocyte 9.7 (A) 2 - 6.9   Granulocyte percent 76.7 37 - 80 %G   RBC 5.05 4.69 - 6.13 M/uL   Hemoglobin 17.9 14.1 - 18.1 g/dL   HCT, POC 40.9 81.1 - 53.7 %   MCV 92.5 80 - 97 fL   MCH, POC 35.4 (A) 27 - 31.2 pg   MCHC 38.2 (A) 31.8 - 35.4 g/dL   RDW, POC 91.4 %   Platelet Count, POC 205 142 - 424 K/uL   MPV 7.6 0 - 99.8 fL    No results found.  ASSESSMENT AND PLAN  Daniel Richmond was seen today for otalgia, chest congestion, cough, night sweats, fever and diarrhea.  Diagnoses and all orders for this visit:  Influenza-like illness  Cough: He has a leukocytosis with a left shift.  This may be an early flu or a superimposed bacterial process.  Will cover with Z-pak for now. Ibuprofen for fever, pain and myalgia.  RTC in 48 hour if not better, sooner if worse.   -     POCT CBC -     albuterol (PROVENTIL) (2.5 MG/3ML) 0.083% nebulizer solution 2.5 mg; Take 3 mLs (2.5 mg total) by nebulization once. -     ipratropium (ATROVENT) nebulizer solution 0.5 mg; Take 2.5 mLs (0.5 mg total) by nebulization once.  Cigarette smoker -     varenicline (CHANTIX PAK) 0.5 MG X 11 & 1 MG X 42 tablet; Take one 0.5 mg tablet by mouth once daily for 3 days, then increase to one 0.5 mg tablet twice daily for 4 days, then increase to one 1 mg tablet twice daily. -     varenicline (CHANTIX CONTINUING MONTH PAK) 1 MG tablet; Take 1 tablet (1 mg total) by mouth 2 (two) times daily. -     azithromycin (ZITHROMAX) 250 MG tablet; Take two daily until finished.      The patient was advised to call or return to clinic if he does not see an improvement in symptoms or to seek the care of the closest  emergency department if he worsens with the above plan.   Deliah BostonMichael Clark, MHS, PA-C [facility] 06/28/2015 9:08 AM

## 2015-06-28 NOTE — Telephone Encounter (Signed)
Patient stated he was seen today and purchased a over the counter cough medicine. Patient stated he taken the cough medicine 3 hours ago and it is not working. Patient is requesting for another cough medcine. [phone].

## 2015-11-12 ENCOUNTER — Emergency Department (HOSPITAL_COMMUNITY)
Admission: EM | Admit: 2015-11-12 | Discharge: 2015-11-13 | Disposition: A | Discharge status: Home / Self Care | Payer: Self-pay | Attending: Emergency Medicine | Admitting: Emergency Medicine

## 2015-11-12 ENCOUNTER — Emergency Department (HOSPITAL_COMMUNITY): Payer: Self-pay

## 2015-11-12 ENCOUNTER — Encounter (HOSPITAL_COMMUNITY): Payer: Self-pay | Admitting: Emergency Medicine

## 2015-11-12 DIAGNOSIS — Y9389 Activity, other specified: Secondary | ICD-10-CM | POA: Insufficient documentation

## 2015-11-12 DIAGNOSIS — Y929 Unspecified place or not applicable: Secondary | ICD-10-CM | POA: Insufficient documentation

## 2015-11-12 DIAGNOSIS — F17210 Nicotine dependence, cigarettes, uncomplicated: Secondary | ICD-10-CM | POA: Insufficient documentation

## 2015-11-12 DIAGNOSIS — J45909 Unspecified asthma, uncomplicated: Secondary | ICD-10-CM | POA: Insufficient documentation

## 2015-11-12 DIAGNOSIS — M25561 Pain in right knee: Secondary | ICD-10-CM | POA: Insufficient documentation

## 2015-11-12 DIAGNOSIS — Z79899 Other long term (current) drug therapy: Secondary | ICD-10-CM | POA: Insufficient documentation

## 2015-11-12 DIAGNOSIS — W11XXXA Fall on and from ladder, initial encounter: Secondary | ICD-10-CM | POA: Insufficient documentation

## 2015-11-12 DIAGNOSIS — Y999 Unspecified external cause status: Secondary | ICD-10-CM | POA: Insufficient documentation

## 2015-11-12 MED ORDER — OXYCODONE-ACETAMINOPHEN 5-325 MG PO TABS
1.0000 | ORAL_TABLET | Freq: Once | ORAL | Status: AC
  Administered 2015-11-12: 1 via ORAL
  Filled 2015-11-12: qty 1

## 2015-11-12 NOTE — ED Provider Notes (Signed)
[facility]-EMERGENCY DEPT Provider Note   CSN: [id number] Arrival date & time: 11/12/15  2207  First Provider Contact:  First MD Initiated Contact with Patient 11/12/15 2233     By signing my name below, I, Daniel Richmond, attest that this documentation has been prepared under the direction and in the presence of Earley Favor, FNP. Electronically Signed: Rosario Richmond, ED Scribe. 11/12/15. 10:42 PM.  History   Chief Complaint Chief Complaint  Patient presents with  . Fall   The history is provided by the patient. No language interpreter was used.   HPI Comments: Daniel Richmond is a 30 y.o. male who presents to the Emergency Department sudden onset, gradually worsening, constant right knee pain and right arm pain s/p fall off of a ladder approximately ~42ft high that occurred 3.5 hours ago. Pt reports that when he fell, he may have landed onto his right knee and when he hit the ground he fell on top of another person that he was working with. No LOC or head injury. He also notes that he sustained several abrasions to his right knee and right arm; however, bleeding is controlled with pressure. His pain is moderately exacerbated with movement. Pt cleaned his injures with hydrogen peroxide and water, and applied a pressure dressing s/p accident, but has not taken any pain medications PTA. He has been ambulatory since the incident without difficulty. Denies neck pain, abdominal pain, back pain, extremity pain, hip pain, HA, nausea, vomiting, or any other associated symptoms. Tetanus is UTD.   Past Medical History:  Diagnosis Date  . Allergy   . Anxiety   . Asthma   . Epidermolysis bullosa 1984-04-11   Patient Active Problem List   Diagnosis Date Noted  . Generalized anxiety disorder 12/22/2012   Past Surgical History:  Procedure Laterality Date  . APPENDECTOMY  age 71    Home Medications    Prior to Admission medications   Medication Sig Start Date End Date Taking? Authorizing  Provider  acetaminophen (TYLENOL) 500 MG tablet Take 1,000 mg by mouth every 6 (six) hours as needed for mild pain, moderate pain or headache. Reported on 06/28/2015   Yes Historical Provider, MD  sertraline (ZOLOFT) 100 MG tablet Take 100 mg by mouth daily.   Yes Historical Provider, MD  azithromycin (ZITHROMAX) 250 MG tablet Take two daily until finished. Patient not taking: Reported on 11/12/2015 06/28/15   Ofilia Neas, PA-C  HYDROcodone-homatropine [facility]) 5-1.5 MG/5ML syrup Take 5 mLs by mouth every 8 (eight) hours as needed for cough. Patient not taking: Reported on 11/12/2015 06/28/15   Ofilia Neas, PA-C  meloxicam (MOBIC) 7.5 MG tablet Take 2 tablets (15 mg total) by mouth daily. Patient not taking: Reported on 06/28/2015 06/11/15   Antony Madura, PA-C  methocarbamol (ROBAXIN) 500 MG tablet Take 1 tablet (500 mg total) by mouth 2 (two) times daily. Patient not taking: Reported on 11/12/2015 06/11/15   Antony Madura, PA-C  oxyCODONE-acetaminophen (PERCOCET/ROXICET) 5-325 MG tablet Take 1-2 tablets by mouth every 6 (six) hours as needed for severe pain. 11/13/15   Earley Favor, NP  predniSONE (DELTASONE) 20 MG tablet Take 2 tablets (40 mg total) by mouth daily. Take 40 mg by mouth daily for 3 days, then 20mg  by mouth daily for 3 days, then 10mg  daily for 3 days Patient not taking: Reported on 11/12/2015 06/11/15   Antony Madura, PA-C  varenicline (CHANTIX CONTINUING MONTH PAK) 1 MG tablet Take 1 tablet (1 mg total) by mouth  2 (two) times daily. Patient not taking: Reported on 11/12/2015 06/28/15   Ofilia Neas, PA-C  varenicline (CHANTIX PAK) 0.5 MG X 11 & 1 MG X 42 tablet Take one 0.5 mg tablet by mouth once daily for 3 days, then increase to one 0.5 mg tablet twice daily for 4 days, then increase to one 1 mg tablet twice daily. Patient not taking: Reported on 11/12/2015 06/28/15   Ofilia Neas, PA-C   Family History Family History  Problem Relation Age of Onset  . Multiple sclerosis Mother   . Cancer  Maternal Grandmother     lung cancer   Social History Social History  Substance Use Topics  . Smoking status: Current Every Day Smoker    Packs/day: 1.00    Years: 7.00    Types: Cigarettes    Start date: 04/25/2005  . Smokeless tobacco: Never Used  . Alcohol use 0.6 oz/week    1 Standard drinks or equivalent per week   Allergies   Tramadol   Review of Systems Review of Systems  Cardiovascular: Negative for chest pain.  Gastrointestinal: Negative for abdominal pain, nausea and vomiting.  Musculoskeletal: Positive for arthralgias (right knee) and myalgias. Negative for back pain, gait problem and neck pain.  Skin: Positive for wound.  Neurological: Negative for syncope and headaches.  All other systems reviewed and are negative.  Physical Exam Updated Vital Signs BP 115/88 (BP Location: Left Arm)   Pulse 79   Temp 98.4 F (36.9 C) (Oral)   Ht  (1.803 m)   Wt 83.9 kg   SpO2 96%   BMI 25.80 kg/m   Physical Exam  Constitutional: He appears well-developed and well-nourished. No distress.  HENT:  Head: Normocephalic.  Poor dentition throughout.   Eyes: Conjunctivae are normal.  Cardiovascular: Normal rate, regular rhythm and normal heart sounds.   No murmur heard. Pulmonary/Chest: Effort normal and breath sounds normal. No respiratory distress. He has no wheezes.  Abdominal: He exhibits no distension.  Musculoskeletal: Normal range of motion.       Arms:      Legs: Abrasions as noted in above diagrams.  Neurological: He is alert.  Skin: Skin is warm and dry. He is not diaphoretic.  Psychiatric: He has a normal mood and affect. His behavior is normal.  Nursing note and vitals reviewed.  ED Treatments / Results  DIAGNOSTIC STUDIES: Oxygen Saturation is 96% on RA, normal by my interpretation.   COORDINATION OF CARE: 10:40 PM-Discussed next steps with pt. Pt verbalized understanding and is agreeable with the plan.   Labs (all labs ordered are listed, but  only abnormal results are displayed) Labs Reviewed - No data to display  EKG  EKG Interpretation None       Radiology Dg Knee Complete 4 Views Right  Result Date: 11/13/2015 CLINICAL DATA:  Status post fall, with right knee pain. Initial encounter. EXAM: RIGHT KNEE - COMPLETE 4+ VIEW COMPARISON:  Right knee radiographs performed 08/15/2013 FINDINGS: There is no evidence of fracture or dislocation. The joint spaces are preserved. No significant degenerative change is seen; the patellofemoral joint is grossly unremarkable in appearance. No significant joint effusion is seen. The visualized soft tissues are normal in appearance. IMPRESSION: No evidence of fracture or dislocation. Electronically Signed   By: Roanna Raider M.D.   On: 11/13/2015 00:03    Procedures Procedures (including critical care time)  Medications Ordered in ED Medications  oxyCODONE-acetaminophen (PERCOCET/ROXICET) 5-325 MG per tablet 1 tablet (1  tablet Oral Given 11/12/15 2249)    Initial Impression / Assessment and Plan / ED Course  I have reviewed the triage vital signs and the nursing notes.  Pertinent labs & imaging results that were available during my care of the patient were reviewed by me and considered in my medical decision making (see chart for details).  Clinical Course   Knee xray normal no fracture patient refuses immobilizer due to skin sensativity     Final Clinical Impressions(s) / ED Diagnoses   Final diagnoses:  Fall from ladder, initial encounter    New Prescriptions New Prescriptions   OXYCODONE-ACETAMINOPHEN (PERCOCET/ROXICET) 5-325 MG TABLET    Take 1-2 tablets by mouth every 6 (six) hours as needed for severe pain.        Earley FavorGail Lacee Grey, NP 11/13/15 0151    Earley FavorGail Wynston Romey, NP 11/13/15 [id number]    Lorre NickAnthony Allen, MD 11/15/15 [phone]

## 2015-11-12 NOTE — ED Triage Notes (Addendum)
Patient reports he fell off a ladder.  Reports that the ladder broke in half.  States his feet were 25 feet off the ground when he fell.  Denies LOC.  Alert and oriented.  Patient reports right knee pain, right arm pain. Ambulatory to triage.

## 2015-11-13 ENCOUNTER — Encounter: Payer: Self-pay | Admitting: Physician Assistant

## 2015-11-13 MED ORDER — OXYCODONE-ACETAMINOPHEN 5-325 MG PO TABS: 1.0000 | ORAL_TABLET | Freq: Four times a day (QID) | ORAL | 0 refills | Status: AC | PRN

## 2015-11-13 NOTE — Discharge Instructions (Signed)
The xray of your knee shows no fracture

## 2015-11-13 NOTE — ED Notes (Signed)
Pt returned wrist band to staff as he was leaving department, pt states he is tired of waiting. Ambulatory to waiting room with steady gait

## 2015-12-10 ENCOUNTER — Encounter: Payer: Self-pay | Admitting: Physical Therapy

## 2015-12-10 ENCOUNTER — Ambulatory Visit: Payer: Self-pay | Attending: Orthopaedic Surgery | Admitting: Physical Therapy

## 2015-12-10 DIAGNOSIS — M25661 Stiffness of right knee, not elsewhere classified: Secondary | ICD-10-CM | POA: Insufficient documentation

## 2015-12-10 DIAGNOSIS — M6281 Muscle weakness (generalized): Secondary | ICD-10-CM | POA: Insufficient documentation

## 2015-12-10 DIAGNOSIS — R262 Difficulty in walking, not elsewhere classified: Secondary | ICD-10-CM | POA: Insufficient documentation

## 2015-12-10 DIAGNOSIS — M25561 Pain in right knee: Secondary | ICD-10-CM | POA: Insufficient documentation

## 2015-12-10 NOTE — Patient Instructions (Signed)
   Copyright  VHI. All rights reserved.  HIP: Flexion / KNEE: Extension, Straight Leg Raise   Raise leg, keeping knee straight. Perform slowly. _5__ reps per set, _1__ sets per day, ___ days per week   Copyright  VHI. All rights reserved.  Heel Slide   Bend knee and pull heel toward buttocks. Hold __5__ seconds. Return. Repeat with other knee. Repeat __10__ times. Do __2__ sessions per day.  http://gt2.exer.us/372   Copyright  VHI. All rights reserved.     Raise leg until knee is straight. __10_ reps per set, __1_ sets per day, __7_ days per week  Copyright  VHI. All rights reserved.  Short Arc [employer]ad   Place a large can or rolled towel under leg. Straighten knee and leg. Hold __3__ seconds. Repeat with other leg. Repeat __10__ times. Do _3___ sessions per day.  http://gt2.exer.us/365   Copyright  VHI. All rights reserved.  Quad Set   Slowly tighten muscles on thigh of straight leg while counting out loud to _5___. Repeat with other leg. Repeat __10__ times. Do _3___ sessions per day.  http://gt2.exer.us/361   Copyright  VHI. All rights reserved.    Lavinia SharpsStacy Simpson PT [facility] [address], [address] [place], [place] [address] Phone # [phone] Fax [phone]

## 2015-12-10 NOTE — Therapy (Signed)
[facility] [address] [place], [place], [address] Phone: [phone]   Fax:  [phone]  Physical Therapy Evaluation  Patient Details  Name: Daniel Richmond MRN: [id number] Date of Birth: 05-Aug-1985 Referring Provider: Dr. Jerl Santos  Encounter Date: 12/10/2015      PT End of Session - 12/10/15 0850    Visit Number 1   Date for PT Re-Evaluation 02/04/16   PT Start Time 0805   PT Stop Time 0852   PT Time Calculation (min) 47 min      Past Medical History:  Diagnosis Date  . Allergy   . Anxiety   . Asthma   . 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 APPENDECTOMY  age 69    There were no vitals filed for this visit.       Subjective Assessment - 12/10/15 0812    Subjective Patient goes by "Daniel Richmond".  I fell off a ladder on 11/12/15 about 20 feet high injuring right medial  knee and hip.  Pops in knee with bending.  Presents using a cane.  Prednisone helped for only 2 days.  Complains of constant pain and difficulty sleeping.  X-ray and MRI normal.  Had shot of cortisone with no benefit.     Pertinent History skin disorder (my skin comes off with any friction) epiderma lysis bellousa;  gout; tendonitis   Limitations Walking;Sitting;House hold activities;Standing   How long can you sit comfortably? better than standing   How long can you stand comfortably? would worsen immediately   How long can you walk comfortably? 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 Yes   Pain Score 8   with standing 14/10   Pain Location Knee   Pain Orientation Right   Pain Type Acute pain   Pain Onset 1 to 4 weeks ago   Pain Frequency Constant   Aggravating Factors  walking and standing;  heat   Pain Relieving Factors driving for distraction            [facility] PT  Assessment - 12/10/15 0001      Assessment   Medical Diagnosis contusion knee with normal MRI scan, right side   Referring Provider Dr. Jerl Santos   Onset Date/Surgical Date 11/12/15   Hand Dominance Right   Next MD Visit tomorrow   Prior Therapy no     Precautions   Precautions None     Restrictions   Weight Bearing Restrictions No     Balance Screen   Has the patient fallen in the past 6 months Yes   How many times? 1x from ladder   Has the patient had a decrease in activity level because of a fear of falling?  Yes   Is the patient reluctant to leave their home because of a fear of falling?  No     Home Environment   Living Environment Private residence   Type of Home House   Home Access Stairs to enter   Home Layout One level   Home Equipment Walker - standard;Cane - single point     Prior Function   Level of Independence Independent with basic ADLs   Vocation Self employed   Leisure driving; work on cars     Observation/Other Assessments   Focus on Therapeutic Outcomes (  FOTO)  72% limited      Posture/Postural Control   Posture/Postural Control No significant limitations   Posture Comments no edema; normal skin temp;  palpable popping     ROM / Strength   AROM / PROM / Strength AROM;Strength     AROM   AROM Assessment Site Hip;Knee;Ankle   Right/Left Knee Right;Left   Right Knee Extension 20   Right Knee Flexion 93   Left Knee Extension 0   Left Knee Flexion 130     Strength   Strength Assessment Site Hip;Knee;Ankle   Right/Left Knee Right;Left   Right Knee Flexion 2-/5   Right Knee Extension 2-/5   Left Knee Flexion 4/5   Left Knee Extension 4/5   Right/Left Ankle Right;Left   Right Ankle Dorsiflexion 5/5     Flexibility   Soft Tissue Assessment /Muscle Length yes   Hamstrings 45 degrees bilaterally     Palpation   Patella mobility decreased all directions   Palpation comment patellar tendon and distal quads     Special Tests    Special Tests  Knee Special Tests   Knee Special tests  Lateral Pull Sign;Patellofemoral Grind Test (Clarke's Sign)     Lateral Pull Sign    Findings Positive   Side Right     Patellofemoral Grind test (Clark's Sign)   Findings Postive   Side  Right                           PT Education - 12/10/15 0848    Education provided Yes   Education Details knee level 1 ex   Person(s) Educated Patient   Methods Explanation;Demonstration;Handout   Comprehension Verbalized understanding;Returned demonstration          PT Short Term Goals - 12/10/15 2001      PT SHORT TERM GOAL #1   Title The patient will demonstrate compliance with basic ROM and muscle setting to promote healing   01/07/16   Time 4   Period Weeks   Status New     PT SHORT TERM GOAL #2   Title The patient will have improved right knee AROM to 15-105 degrees needed for greater ease getting in/out of the car   Time 4   Period Weeks   Status New     PT SHORT TERM GOAL #3   Title The patient will report a 25% improvement in pain with ADLs and night pain   Time 4   Period Weeks   Status New     PT SHORT TERM GOAL #4   Title The patient will be able to walk 10 min needed for short distance community ambulation   Time 4   Period Weeks   Status New           PT Long Term Goals - 12/10/15 2009      PT LONG TERM GOAL #1   Title The patient will be independent in safe self progression of HEP needed for further improvements in ROM and strength  02/04/16   Time 8   Period Weeks   Status New     PT LONG TERM GOAL #2   Title The patient will have improved right knee AROM 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   Time 8   Period Weeks   Status New     PT LONG TERM GOAL #3   Title Right quad and HS strength  at least 3+/5 needed for standing 15-20 for work    Time 8   Period Weeks   Status New     PT LONG TERM GOAL #4   Title The patient will report a 50% improvement in overall pain  and ability to sleep   Time 8   Period Weeks   Status New     PT LONG TERM GOAL #5   Title FOTO functional outcome score improved to 41% indicating improved function with less pain   Time 8   Period Weeks   Status New               Plan - 12/10/15 0909    Clinical Impression Statement The patient is a 30 year old male who fell off a ladder on 11/12/15 resulting in right medial knee pain.  He reports severe pain constantly 7/10 at best but will worsen to "a 14 out of 10" with standing and walking.  He is using a single point cane full time.  He reports no improvement with prednisone or injection and states the doctor will no refill his pain medicine.   x-ray and MRI negative.   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  He reports he has not slept in 1 week.   Patient reports painful popping frequently.   Mild peri-patellar swelling.  Painful and limited knee AROM 20-93 degrees in sitting.  In supine, patient is able to fully extend knee.  Lack quad motor control to do a SLR.  Decreased quad set activation.  Palpable and audible bilateral knee crepitus right > left.  The patient is of moderate complexity evaluation secondary evolving condition,  co-morbidities and lack of home support and financial stresses.     Rehab Potential Good   Clinical Impairments Affecting Rehab Potential epidermolysis bullosa skin disorder (skin very fragile-  no tape, no friction)   PT Frequency 2x / week   PT Duration 8 weeks   PT Treatment/Interventions ADLs/Self Care Home Management;Cryotherapy;Electrical Stimulation;Iontophoresis 4mg /ml Dexamethasone;Ultrasound;Moist Heat;Therapeutic exercise;Gait training;Patient/family education;Manual techniques;Dry needling;Neuromuscular re-education   PT Next Visit Plan Start Nu-Step;  e-stim for pain control (possibly with ex's secondary to pain severity;  low level knee AROM and gentle quad activation ex;  vasocompression      Patient will benefit from  skilled therapeutic intervention in order to improve the following deficits and impairments:  Abnormal gait, Decreased activity tolerance, Difficulty walking, Decreased strength, Pain, Decreased range of motion, Increased edema, Impaired flexibility  Visit Diagnosis: Pain in right knee - Plan: PT plan of care cert/re-cert  Stiffness of right knee, not elsewhere classified - Plan: PT plan of care cert/re-cert  Difficulty in walking, not elsewhere classified - Plan: PT plan of care cert/re-cert  Muscle weakness (generalized) - Plan: PT plan of care cert/re-cert     Problem List Patient Active Problem List   Diagnosis Date Noted  . Generalized anxiety disorder 12/22/2012   [name], PT 12/10/15 8:17 PM Phone: [phone] Fax: [phone] Vivien Presto 12/10/2015, 8:17 PM  [facility] [address], [address] [place], [place], [address] Phone: [phone]   Fax:  [phone]  Name: VEDH PTACEK MRN: [id number] Date of Birth: 1985/05/11

## 2015-12-12 ENCOUNTER — Encounter: Payer: Self-pay | Admitting: Physical Therapy

## 2015-12-17 ENCOUNTER — Ambulatory Visit: Payer: Self-pay | Admitting: Physical Therapy

## 2015-12-17 DIAGNOSIS — M25661 Stiffness of right knee, not elsewhere classified: Secondary | ICD-10-CM

## 2015-12-17 DIAGNOSIS — M25561 Pain in right knee: Secondary | ICD-10-CM

## 2015-12-17 DIAGNOSIS — M6281 Muscle weakness (generalized): Secondary | ICD-10-CM

## 2015-12-17 DIAGNOSIS — R262 Difficulty in walking, not elsewhere classified: Secondary | ICD-10-CM

## 2015-12-17 NOTE — Therapy (Signed)
[facility] [address] [place], [place], [address] Phone: [phone]   Fax:  [phone]  Physical Therapy Treatment  Patient Details  Name: Daniel AlyMichael R Valbuena MRN: [id number] Date of Birth: 04/08/1985 Referring Provider: Dr. Jerl Santosalldorf  Encounter Date: 12/17/2015      PT End of Session - 12/17/15 1313    Visit Number 2   Date for PT Re-Evaluation 02/04/16   PT Start Time 1235   PT Stop Time 1322   PT Time Calculation (min) 47 min   Activity Tolerance Patient tolerated treatment well      Past Medical History:  Diagnosis Date  . Allergy   . Anxiety   . Asthma   . Epidermolysis bullosa 08/12/1984    Past Surgical History:  Procedure Laterality Date  . APPENDECTOMY  age 30    There were no vitals filed for this visit.      Subjective Assessment - 12/17/15 1235    Subjective Patient presents with SPC.  "Pain still as bad as it was under that knee cap."  The swelling is down.  "My lawyer has set up an appointment for another doctor's appt for another opinion."   Pertinent History "Alycia RossettiRyan"     Currently in Pain? Yes   Pain Score 8    Pain Location Knee   Pain Orientation Right   Pain Type Acute pain   Pain Onset More than a month ago   Pain Frequency Constant                         [facility] Adult PT Treatment/Exercise - 12/17/15 0001      Knee/Hip Exercises: Aerobic   Nustep 3 min seat 10     Knee/Hip Exercises: Seated   Long Arc Quad --  attempted but discontinued secondary to pain   Ball Squeeze 20 x   Clamshell with TheraBand Red   Knee/Hip Flexion 15x    Other Seated Knee/Hip Exercises foam roll flexion/extension on floor with e-stim   Other Seated Knee/Hip Exercises heel press into floor 10x     Knee/Hip Exercises: Supine   Short Arc Quad Sets AROM;Right;15 reps   Other Supine Knee/Hip Exercises red ball roll for flexion 50x     Electrical Stimulation   Electrical  Stimulation Location right knee   Electrical Stimulation Action IFC   Electrical Stimulation Parameters 20 ma 20 min    Electrical Stimulation Goals Pain                  PT Short Term Goals - 12/17/15 1358      PT SHORT TERM GOAL #1   Title The patient will demonstrate compliance with basic ROM and muscle setting to promote healing   01/07/16   Time 4   Period Weeks   Status On-going     PT SHORT TERM GOAL #2   Title The patient will have improved right knee AROM to 15-105 degrees needed for greater ease getting in/out of the car   Time 4   Period Weeks   Status On-going     PT SHORT TERM GOAL #3   Title The patient will report a 25% improvement in pain with ADLs and night pain   Time 4   Period Weeks   Status On-going     PT SHORT TERM GOAL #4   Title The patient will be able to walk 10 min needed for short distance community  ambulation   Time 4   Period Weeks   Status On-going           PT Long Term Goals - 12/17/15 1359      PT LONG TERM GOAL #1   Title The patient will be independent in safe self progression of HEP needed for further improvements in ROM and strength  02/04/16   Time 8   Period Weeks   Status On-going     PT LONG TERM GOAL #2   Title The patient will have improved right knee AROM 10- 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 degrees 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   Time 8   Period Weeks   Status On-going     PT LONG TERM GOAL #3   Title Right quad and HS strength at least 3+/5 needed for standing 15-20 for work    Time 8   Period Weeks   Status On-going     PT LONG TERM GOAL #4   Title The patient will report a 50% improvement in overall pain and ability to sleep   Time 8   Period Weeks   Status On-going     PT LONG TERM GOAL #5   Title FOTO functional outcome score improved to 41% indicating improved function with less pain   Time 8   Period Weeks   Status On-going               Plan - 12/17/15 1310    Clinical Impression Statement The  patient continues to be in constant severe pain although he is able to participate in low level knee ROM and strengthening exercises with e-stim during ex for pain control.  Improved quad muscle activation noted.  No visible swelling today.  Patient reports muscular fatigue following exercises.  He states he will be standing a lot today which will probably cause him to "overdo it" today.  Therapist closely monitoring pain and modifying as needed.    PT Next Visit Plan Continue Nu-Step;  try [place] e-stim to quads for neuromuscular re-ed;  low level knee AROM; gentle strengthening      Patient will benefit from skilled therapeutic intervention in order to improve the following deficits and impairments:     Visit Diagnosis: Pain in right knee  Stiffness of right knee, not elsewhere classified  Difficulty in walking, not elsewhere classified  Muscle weakness (generalized)     Problem List Patient Active Problem List   Diagnosis Date Noted  . Generalized anxiety disorder 12/22/2012   [name], PT 12/17/15 2:02 PM Phone: [phone] Fax: [phone]  Vivien Presto 12/17/2015, 2:02 PM  [facility] [address] [place], [place], [address] Phone: [phone]   Fax:  [phone]  Name: ACHERON SUGG MRN: [id number] Date of Birth: 18-May-1985

## 2015-12-19 ENCOUNTER — Ambulatory Visit: Payer: Self-pay | Admitting: Physical Therapy

## 2015-12-19 DIAGNOSIS — M25561 Pain in right knee: Secondary | ICD-10-CM

## 2015-12-19 DIAGNOSIS — R262 Difficulty in walking, not elsewhere classified: Secondary | ICD-10-CM

## 2015-12-19 DIAGNOSIS — M25661 Stiffness of right knee, not elsewhere classified: Secondary | ICD-10-CM

## 2015-12-19 DIAGNOSIS — M6281 Muscle weakness (generalized): Secondary | ICD-10-CM

## 2015-12-19 NOTE — Therapy (Signed)
[facility] [address] [place], [place], [address] Phone: [phone]   Fax:  [phone]  Physical Therapy Treatment  Patient Details  Name: Daniel Richmond MRN: [id number] Date of Birth: February 14, 1986 Referring Provider: Dr. Jerl Santos  Encounter Date: 12/19/2015      PT End of Session - 12/19/15 1201    Visit Number 3   Date for PT Re-Evaluation 02/04/16   PT Start Time 1145   PT Stop Time 1230   PT Time Calculation (min) 45 min   Activity Tolerance Patient tolerated treatment well      Past Medical History:  Diagnosis Date  . Allergy   . Anxiety   . Asthma   . Epidermolysis bullosa 12-20-84    Past Surgical History:  Procedure Laterality Date  . APPENDECTOMY  age 65    There were no vitals filed for this visit.      Subjective Assessment - 12/19/15 1149    Subjective Today I'm not hurting as much.  I slept about 14 hours.  Using [facility] but improved weight bearing.     Currently in Pain? Yes   Pain Score 6    Pain Location Knee   Pain Orientation Right                         [facility] Adult PT Treatment/Exercise - 12/19/15 0001      Knee/Hip Exercises: Aerobic   Nustep Seat 9 8 min L1     Knee/Hip Exercises: Seated   Long Arc Quad AROM;Right;1 set;10 reps   Heel Slides Limitations rocker board 15x   Other Seated Knee/Hip Exercises foam roll flexion/extension on floor with e-stim   Other Seated Knee/Hip Exercises heel press into floor 10x     Knee/Hip Exercises: Supine   Short Arc Quad Sets AROM;Right;15 reps   Straight Leg Raises AROM;Right;10 reps   Other Supine Knee/Hip Exercises red ball roll for flexion 50x   Other Supine Knee/Hip Exercises bridge over ball 10x; HS sets on ball 15x     Knee/Hip Exercises: Sidelying   Hip ABduction AROM;Right;1 set;15 reps     Programme researcher, broadcasting/film/video Location right knee   Electrical Stimulation Action Pre-mod   Electrical Stimulation Parameters 25 ma 20 min   Electrical Stimulation Goals Pain                  PT Short Term Goals - 12/19/15 1235      PT SHORT TERM GOAL #1   Title The patient will demonstrate compliance with basic ROM and muscle setting to promote healing   01/07/16   Time 4   Period Weeks   Status On-going     PT SHORT TERM GOAL #2   Title The patient will have improved right knee AROM to 15-105 degrees needed for greater ease getting in/out of the car   Time 4   Period Weeks   Status On-going     PT SHORT TERM GOAL #3   Title The patient will report a 25% improvement in pain with ADLs and night pain   Time 4   Period Weeks   Status On-going           PT Long Term Goals - 12/19/15 1235      PT LONG TERM GOAL #1   Title The patient will be independent in safe self progression of HEP needed for further improvements in ROM and  strength  02/04/16   Time 8   Period Weeks   Status On-going     PT LONG TERM GOAL #2   Title The patient will have improved right knee AROM 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   Time 8   Period Weeks   Status On-going     PT LONG TERM GOAL #3   Title Right quad and HS strength at least 3+/5 needed for standing 15-20 for work    Time 8   Period Weeks   Status On-going     PT LONG TERM GOAL #4   Title The patient will report a 50% improvement in overall pain and ability to sleep   Time 8   Period Weeks   Status On-going     PT LONG TERM GOAL #5   Title FOTO functional outcome score improved to 41% indicating improved function with less pain   Time 8   Period Weeks   Status On-going               Plan - 12/19/15 1210    Clinical Impression Statement The patient has improved knee ROM with decreased pain behaviors.  Able to walk short distances in clinic without cane.   Progressing with ROM and muscle reactivation.   Able to do a SLR (with quad lag) and LAQ for the first time today.   Therapist closely monitoring response with all interventions.  Patient reports muscular fatigue following treatment session.     PT Next Visit Plan Continue Nu-Step;  try [place] e-stim to quads for neuromuscular re-ed;  low level knee AROM; gentle strengthening; measure knee AROM      Patient will benefit from skilled therapeutic intervention in order to improve the following deficits and impairments:     Visit Diagnosis: Pain in right knee  Stiffness of right knee, not elsewhere classified  Difficulty in walking, not elsewhere classified  Muscle weakness (generalized)     Problem List Patient Active Problem List   Diagnosis Date Noted  . Generalized anxiety disorder 12/22/2012   Daniel SharpsStacy Richmond, PT 12/19/15 12:37 PM Phone: [phone] Fax: [phone]  Daniel PrestoSimpson, Daniel C 12/19/2015, 12:37 PM  [facility] [address] [place], [place], [address] Phone: [phone]   Fax:  [phone]  Name: Daniel AlyMichael R Richmond MRN: [id number] Date of Birth: 12/01/1985

## 2015-12-24 ENCOUNTER — Encounter: Payer: Self-pay | Admitting: Physical Therapy

## 2015-12-24 ENCOUNTER — Ambulatory Visit: Payer: Self-pay | Admitting: Physical Therapy

## 2015-12-24 DIAGNOSIS — M6281 Muscle weakness (generalized): Secondary | ICD-10-CM

## 2015-12-24 DIAGNOSIS — R262 Difficulty in walking, not elsewhere classified: Secondary | ICD-10-CM

## 2015-12-24 DIAGNOSIS — M25661 Stiffness of right knee, not elsewhere classified: Secondary | ICD-10-CM

## 2015-12-24 DIAGNOSIS — M25561 Pain in right knee: Secondary | ICD-10-CM

## 2015-12-24 NOTE — Therapy (Signed)
[facility] [address], [address] [place], [place], [address] Phone: [phone]   Fax:  [phone]  Physical Therapy Treatment  Patient Details  Name: Daniel Richmond MRN: [id number] Date of Birth: 01/19/1986 Referring Provider: Dr. Jerl Santosalldorf  Encounter Date: 12/24/2015      PT End of Session - 12/24/15 1421    Visit Number 4   Date for PT Re-Evaluation 02/04/16   PT Start Time 1402   PT Stop Time 1446   PT Time Calculation (min) 44 min   Activity Tolerance Patient tolerated treatment well      Past Medical History:  Diagnosis Date  . Allergy   . Anxiety   . Asthma   . 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 APPENDECTOMY  age 30    There were no vitals filed for this visit.      Subjective Assessment - 12/24/15 1404    Subjective Pt reports high pain today. Stated "I over did it yesterday and today."   Limitations Walking;Sitting;House hold activities;Standing   How long can you sit comfortably? better than standing   How long can you stand comfortably? would worsen immediately   How long can you walk comfortably? 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 Yes   Pain Score 9    Pain Location Knee   Pain Orientation Right   Pain Onset More than a month ago            [facility] PT Assessment - 12/24/15 0001      AROM   Right Knee Flexion 81   Left Knee Extension 0   Left Knee Flexion 130                     [facility] Adult PT Treatment/Exercise - 12/24/15 0001      Knee/Hip Exercises: Aerobic   Nustep Seat 9 6 min L1  only able to tolerate 6 minutes     Knee/Hip Exercises: Seated   Long Arc Quad AROM;Right;1 set;10 reps   Heel Slides Limitations rocker board 15x   Other Seated Knee/Hip Exercises foam roll flexion/extension on  floor with e-stim   Other Seated Knee/Hip Exercises heel press into floor 10x     Knee/Hip Exercises: Supine   Short Arc Quad Sets AROM;Right;15 reps   Straight Leg Raises AROM;Right;10 reps   Other Supine Knee/Hip Exercises red ball roll for flexion 50x  cues to use abdominals   Other Supine Knee/Hip Exercises bridge over ball 10x; HS sets on ball 15x     Knee/Hip Exercises: Sidelying   Hip ABduction AROM;Right;1 set;15 reps     Programme researcher, broadcasting/film/videolectrical Stimulation   Electrical Stimulation Location right knee   Electrical Stimulation Action pre mod   Electrical Stimulation Parameters 25 ma 20 min   Electrical Stimulation Goals Pain                  PT Short Term Goals - 12/24/15 1405      PT SHORT TERM GOAL #1   Title The patient will demonstrate compliance with basic ROM and muscle setting to promote healing   01/07/16   Time 4   Period Weeks   Status On-going     PT SHORT TERM GOAL #2   Title The patient will  have improved right knee AROM to 15-105 degrees needed for greater ease getting in/out of the car   Time 4   Period Weeks   Status On-going     PT SHORT TERM GOAL #3   Title The patient will report a 25% improvement in pain with ADLs and night pain   Time 4   Period Weeks   Status On-going     PT SHORT TERM GOAL #4   Title The patient will be able to walk 10 min needed for short distance community ambulation   Time 4   Period Weeks   Status On-going           PT Long Term Goals - 12/24/15 1406      PT LONG TERM GOAL #1   Title The patient will be independent in safe self progression of HEP needed for further improvements in ROM and strength  02/04/16   Time 8   Period Weeks   Status On-going     PT LONG TERM GOAL #2   Title The patient will have improved right knee AROM 10- 120 degrees 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   Time 8   Period Weeks   Status On-going     PT LONG TERM GOAL #3   Title Right quad and HS strength at least 3+/5 needed  for standing 15-20 for work    Time 8   Period Weeks   Status On-going     PT LONG TERM GOAL #4   Title The patient will report a 50% improvement in overall pain and ability to sleep   Time 8   Period Weeks   Status On-going     PT LONG TERM GOAL #5   Title FOTO functional outcome score improved to 41% indicating improved function with less pain   Time 8   Period Weeks   Status On-going               Plan - 12/24/15 1437    Clinical Impression Statement Pt in a lot of pain today but able to complete all exercises. Some quad lag with straight leg raise but able to complete. Will continue to increase strength and ROM.     Rehab Potential Good   Clinical Impairments Affecting Rehab Potential epidermolysis bullosa skin disorder (skin very fragile-  no tape, no friction)   PT Frequency 2x / week   PT Duration 8 weeks   PT Treatment/Interventions ADLs/Self Care Home Management;Cryotherapy;Electrical Stimulation;Iontophoresis 4mg /ml Dexamethasone;Ultrasound;Moist Heat;Therapeutic exercise;Gait training;Patient/family education;Manual techniques;Dry needling;Neuromuscular re-education   PT Next Visit Plan Continue Nu-Step;  try [place] e-stim to quads for neuromuscular re-ed;  low level knee AROM; gentle strengthening      Patient will benefit from skilled therapeutic intervention in order to improve the following deficits and impairments:  Abnormal gait, Decreased activity tolerance, Difficulty walking, Decreased strength, Pain, Decreased range of motion, Increased edema, Impaired flexibility  Visit Diagnosis: Pain in right knee  Stiffness of right knee, not elsewhere classified  Difficulty in walking, not elsewhere classified  Muscle weakness (generalized)     Problem List Patient Active Problem List   Diagnosis Date Noted  . Generalized anxiety disorder 12/22/2012    Dessa Phi PTA 12/24/2015, 4:45 PM  [facility] [address], [address] [place], [place], [address] Phone: [phone]   Fax:  [phone]  Name: Daniel Richmond MRN: [id number] Date of Birth: [birth year]

## 2015-12-26 ENCOUNTER — Ambulatory Visit: Payer: Self-pay | Admitting: Physical Therapy

## 2015-12-26 DIAGNOSIS — R262 Difficulty in walking, not elsewhere classified: Secondary | ICD-10-CM

## 2015-12-26 DIAGNOSIS — M25561 Pain in right knee: Secondary | ICD-10-CM

## 2015-12-26 DIAGNOSIS — M6281 Muscle weakness (generalized): Secondary | ICD-10-CM

## 2015-12-26 DIAGNOSIS — M25661 Stiffness of right knee, not elsewhere classified: Secondary | ICD-10-CM

## 2015-12-26 NOTE — Therapy (Addendum)
[facility] [address], [place], [place], [address] Phone: [phone]   Fax:  [phone]  Physical Therapy Treatment/Discharge Summary  Patient Details  Name: Daniel Richmond MRN: [id number] Date of Birth: 1985-12-07 Referring Provider: Dr. Rhona Raider  Encounter Date: 12/26/2015      PT End of Session - 12/26/15 1554    Visit Number 5   Date for PT Re-Evaluation 02/04/16   PT Start Time 1528   PT Stop Time 1628   PT Time Calculation (min) 60 min   Activity Tolerance Patient limited by pain      Past Medical History:  Diagnosis Date  . Allergy   . Anxiety   . Asthma   . Epidermolysis bullosa 1985/02/20    Past Surgical History:  Procedure Laterality Date  . APPENDECTOMY  age 54    There were no vitals filed for this visit.      Subjective Assessment - 12/26/15 1533    Subjective I've been overdoing it the last few days.  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  Mild peripatellar pain.  I took my last pain pill today.    Currently in Pain? Yes   Pain Score 8    Pain Location Knee   Pain Orientation Right   Pain Type Acute pain   Pain Frequency Constant   Aggravating Factors  walking and standing            [facility] PT Assessment - 12/26/15 0001      AROM   Right Knee Extension 15   Right Knee Flexion 116  seated with foam roll                     [facility] Adult PT Treatment/Exercise - 12/26/15 0001      Knee/Hip Exercises: Aerobic   Nustep Seat 11 8 min     Knee/Hip Exercises: Seated   Heel Slides Limitations rocker board 15x   Ball Squeeze 20 x   Clamshell with TheraBand Red   Other Seated Knee/Hip Exercises foam roll flexion/extension on floor with e-stim   Other Seated Knee/Hip Exercises --     Knee/Hip Exercises: Supine   Short Arc Quad Sets AROM;Right;15 reps   Straight Leg Raises AROM;Right;10 reps   Other Supine Knee/Hip Exercises red ball roll for flexion 50x   cues to use abdominals   Other Supine Knee/Hip Exercises bridge over ball 10x; HS sets on ball 15x     Knee/Hip Exercises: Sidelying   Hip ABduction AROM;Right;1 set;15 reps   Other Sidelying Knee/Hip Exercises knee bends with strap 20x     Electrical Stimulation   Electrical Stimulation Location right knee   Electrical Stimulation Action IFC   Electrical Stimulation Parameters 25 ma 15 min   Electrical Stimulation Goals Pain     Vasopneumatic   Number Minutes Vasopneumatic  15 minutes   Vasopnuematic Location  Knee   Vasopneumatic Pressure Medium   Vasopneumatic Temperature  32                   PT Short Term Goals - 12/26/15 1718      PT SHORT TERM GOAL #1   Title The patient will demonstrate compliance with basic ROM and muscle setting to promote healing   01/07/16   Status Achieved     PT SHORT TERM GOAL #2   Title The patient will have improved right knee AROM to 15-105 degrees needed for greater ease getting  in/out of the car   Status Achieved     PT SHORT TERM GOAL #3   Title The patient will report a 25% improvement in pain with ADLs and night pain   Time 4   Period Weeks   Status On-going     PT SHORT TERM GOAL #4   Title The patient will be able to walk 10 min needed for short distance community ambulation   Time 4   Period Weeks   Status On-going           PT Long Term Goals - 12/24/15 1406      PT LONG TERM GOAL #1   Title The patient will be independent in safe self progression of HEP needed for further improvements in ROM and strength  02/04/16   Time 8   Period Weeks   Status On-going     PT LONG TERM GOAL #2   Title The patient will have improved right knee AROM 10- 120 degrees 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   Time 8   Period Weeks   Status On-going     PT [place] #3   Title Right quad and HS strength at least 3+/5 needed for standing 15-20 for work    Time 8   Period Weeks   Status On-going     PT LONG  TERM GOAL #4   Title The patient will report a 50% improvement in overall pain and ability to sleep   Time 8   Period Weeks   Status On-going     PT LONG TERM GOAL #5   Title FOTO functional outcome score improved to 41% indicating improved function with less pain   Time 8   Period Weeks   Status On-going               Plan - 12/26/15 1605    Clinical Impression Statement Patient still very painful but much improved active-assisted knee flexion to 116 degrees in sitting and 125 degrees in  sidelying.  Slower progress with goals secondary to pain severity.  Patient reports good pain relief with interferential e-stim and vasocompression.  Therapist providing moderate verbal cueing to encourage full knee extension and to monitor pain changes, modifying as needed.       Rehab Potential Good   Clinical Impairments Affecting Rehab Potential epidermolysis bullosa skin disorder (skin very fragile-  no tape, no friction)   PT Frequency 2x / week   PT Duration 8 weeks   PT Treatment/Interventions ADLs/Self Care Home Management;Cryotherapy;Electrical Stimulation;Iontophoresis 59m/ml Dexamethasone;Ultrasound;Moist Heat;Therapeutic exercise;Gait training;Patient/family education;Manual techniques;Dry needling;Neuromuscular re-education   PT Next Visit Plan Continue Nu-Step;  try RTurkmenistane-stim to quads for neuromuscular re-ed;  low level knee AROM; gentle strengthening;  assess response to vasocompression;  try retrostepping     PHYSICAL THERAPY DISCHARGE SUMMARY  Visits from Start of Care: 5  Current functional level related to goals / functional outcomes: The patient no-showed for last 3 appointments.  When called, the patient states he will be getting a MRI in another couple of weeks and doubts he will be returning to PT.     Remaining deficits: The patient made gains in ROM but continued to be pain limited with all.     Education / Equipment: Basic HEP  Plan: Patient agrees to  discharge.  Patient goals were not met. Patient is being discharged due to the patient's request.  ?????       Patient will benefit  from skilled therapeutic intervention in order to improve the following deficits and impairments:  Abnormal gait, Decreased activity tolerance, Difficulty walking, Decreased strength, Pain, Decreased range of motion, Increased edema, Impaired flexibility  Visit Diagnosis: Pain in right knee  Stiffness of right knee, not elsewhere classified  Difficulty in walking, not elsewhere classified  Muscle weakness (generalized)     Problem List Patient Active Problem List   Diagnosis Date Noted  . Generalized anxiety disorder 12/22/2012   Ruben Im, PT 12/26/15 5:26 PM Phone: [phone] Fax: [phone]  Alvera Singh 12/26/2015, 5:26 PM  [facility] [address] [place], [place], [address] Phone: [phone]   Fax:  [phone]  Name: Daniel Richmond MRN: [id number] Date of Birth: 1985-09-10

## 2015-12-31 ENCOUNTER — Ambulatory Visit: Payer: Self-pay | Admitting: Physical Therapy

## 2016-01-02 ENCOUNTER — Telehealth: Payer: Self-pay | Admitting: Physical Therapy

## 2016-01-02 ENCOUNTER — Ambulatory Visit: Payer: Self-pay | Admitting: Physical Therapy

## 2016-01-02 NOTE — Telephone Encounter (Signed)
Pt contacted about missed appointment.  Daniel AppleKatie Pelagia Richmond, PTA

## 2016-01-07 ENCOUNTER — Encounter: Payer: Self-pay | Admitting: Physical Therapy

## 2016-01-09 ENCOUNTER — Ambulatory Visit: Payer: Self-pay | Admitting: Physical Therapy

## 2016-12-04 IMAGING — CR DG KNEE COMPLETE 4+V*R*
4 series · 4 of 4 positions shown · non-contrast
Comparison: Right knee radiographs performed 08/15/2013

CLINICAL DATA: Status post fall, with right knee pain. Initial
encounter.

EXAM:
RIGHT KNEE - COMPLETE 4+ VIEW

[x knee ap right (1 of 3)]
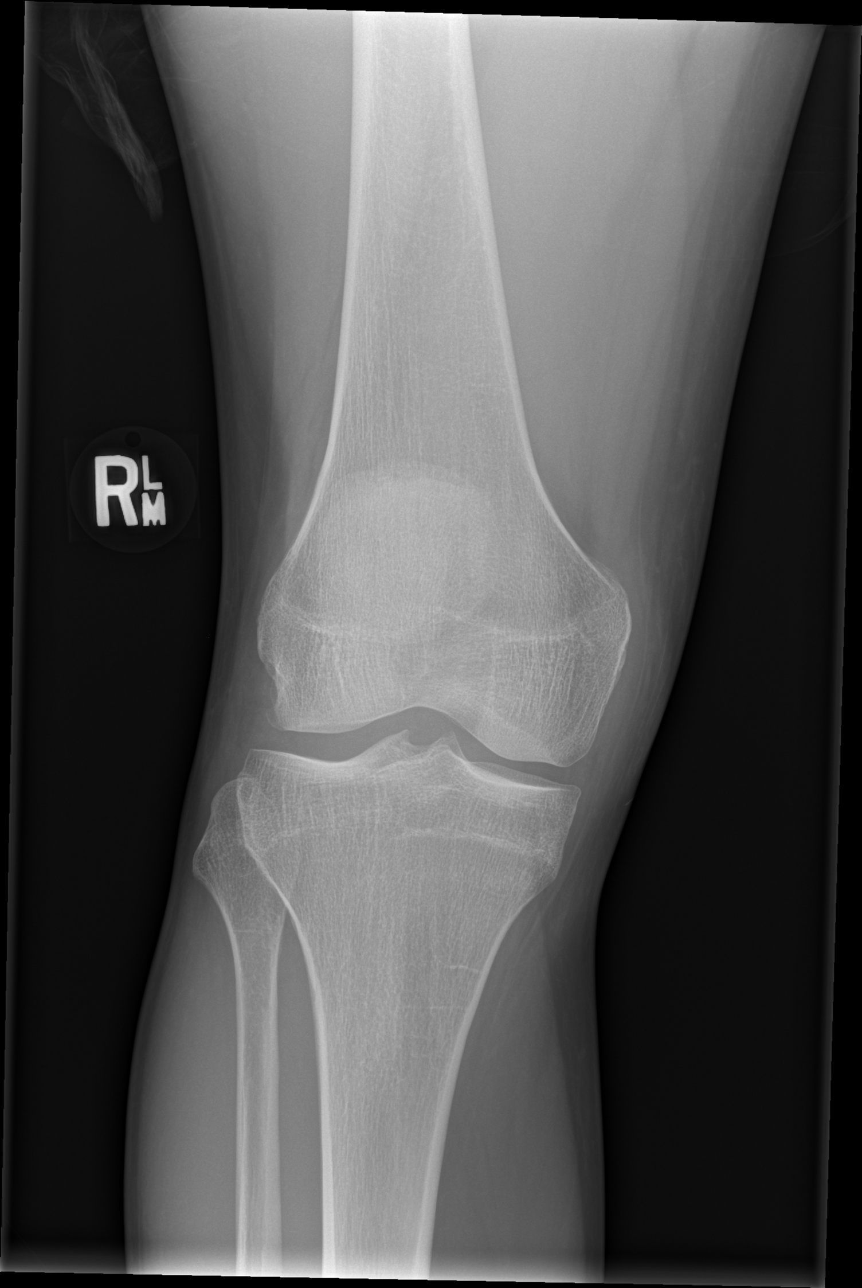

[x knee ap right (2 of 3)]
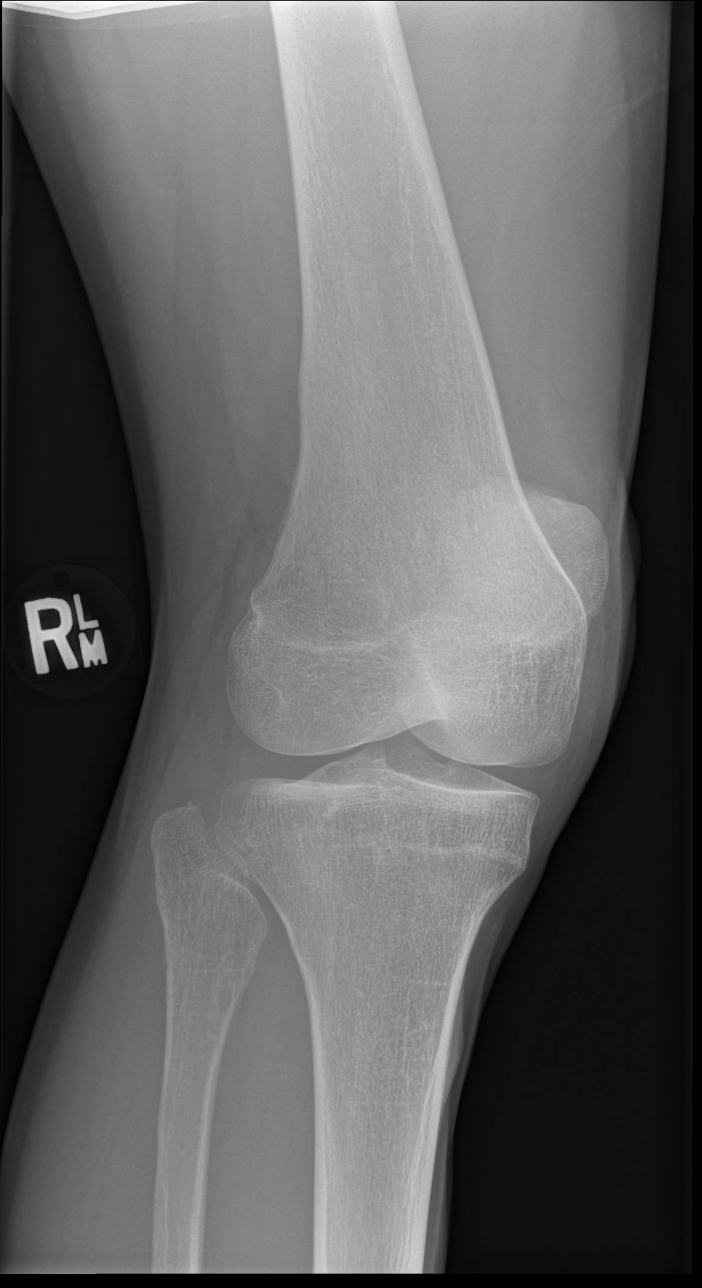

[x knee ap right (3 of 3)]
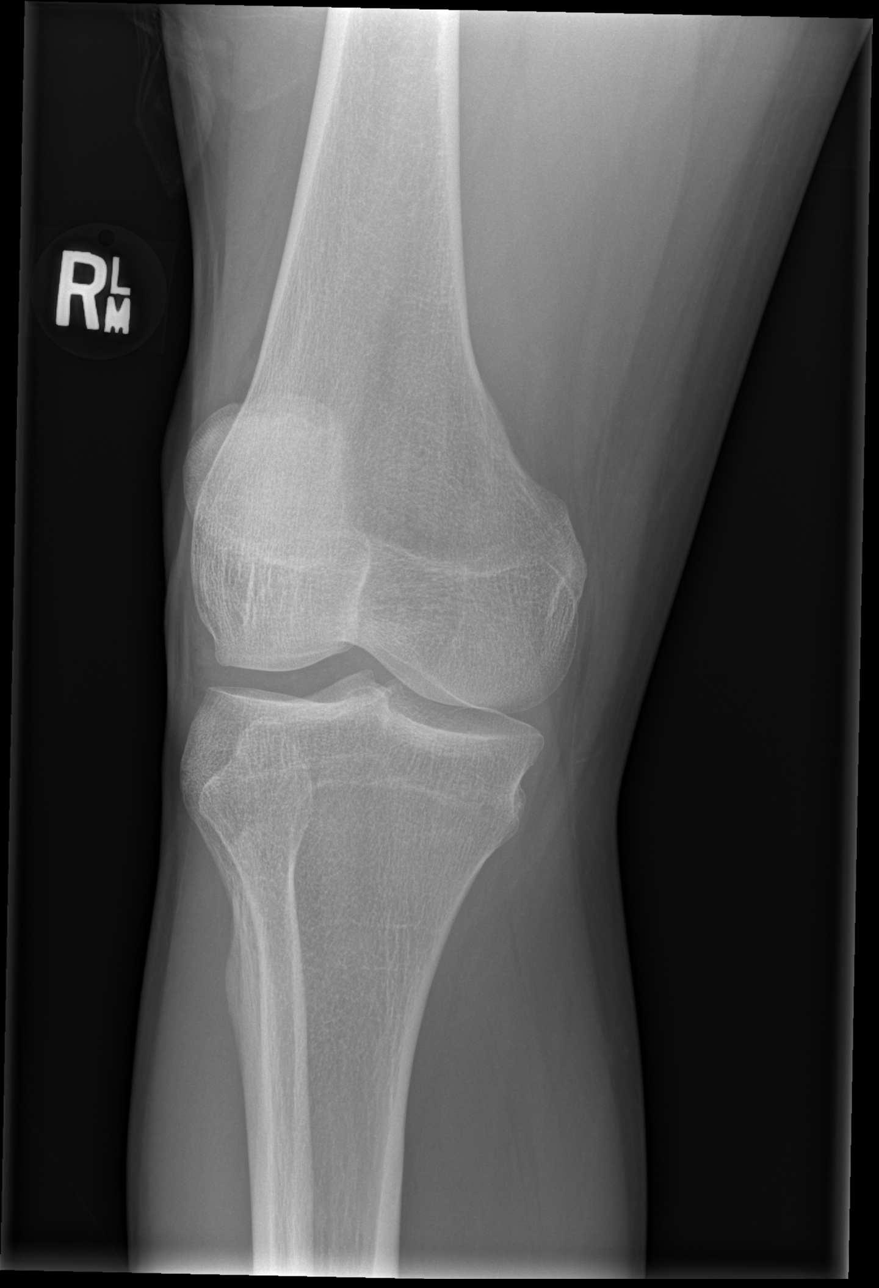

[x knee lat right]
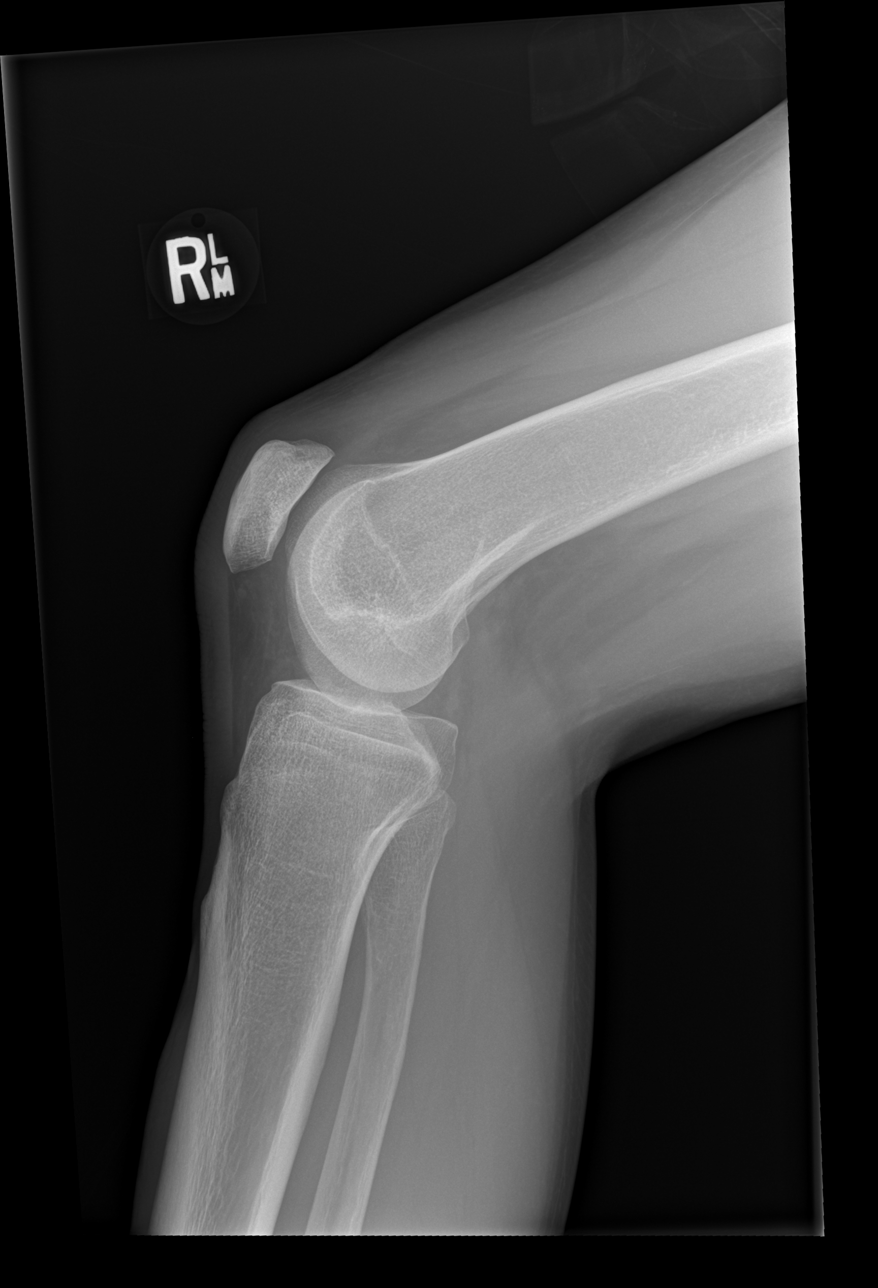

[4 of 4 positions shown; findings below may reference images not displayed]

FINDINGS: There is no evidence of fracture or dislocation. The joint spaces
are preserved. No significant degenerative change is seen; the
patellofemoral joint is grossly unremarkable in appearance.

No significant joint effusion is seen. The visualized soft tissues
are normal in appearance.
IMPRESSION: No evidence of fracture or dislocation.
